# Patient Record
Sex: Male | Born: 1985 | Race: White | Hispanic: No | Marital: Married | Smoking: Never smoker
Health system: Southern US, Community
[De-identification: ages and names within clinical notes are randomized; demographics above are authoritative.]

## PROBLEM LIST (undated history)

## (undated) DIAGNOSIS — T7840XA Allergy, unspecified, initial encounter: Secondary | ICD-10-CM

## (undated) DIAGNOSIS — F32A Depression, unspecified: Secondary | ICD-10-CM

## (undated) DIAGNOSIS — F419 Anxiety disorder, unspecified: Secondary | ICD-10-CM

## (undated) HISTORY — PX: UPPER GI ENDOSCOPY: SHX6162

## (undated) HISTORY — DX: Depression, unspecified: F32.A

## (undated) HISTORY — PX: GUM SURGERY: SHX658

## (undated) HISTORY — DX: Anxiety disorder, unspecified: F41.9

## (undated) HISTORY — DX: Allergy, unspecified, initial encounter: T78.40XA

---

## 2016-07-14 ENCOUNTER — Encounter (HOSPITAL_COMMUNITY): Payer: Self-pay

## 2016-07-14 ENCOUNTER — Emergency Department (HOSPITAL_COMMUNITY)
Admission: EM | Admit: 2016-07-14 | Discharge: 2016-07-15 | Disposition: A | Discharge status: Home / Self Care | Payer: 59 | Attending: Emergency Medicine | Admitting: Emergency Medicine

## 2016-07-14 DIAGNOSIS — R197 Diarrhea, unspecified: Secondary | ICD-10-CM | POA: Insufficient documentation

## 2016-07-14 DIAGNOSIS — Z79899 Other long term (current) drug therapy: Secondary | ICD-10-CM | POA: Diagnosis not present

## 2016-07-14 DIAGNOSIS — R112 Nausea with vomiting, unspecified: Secondary | ICD-10-CM | POA: Diagnosis present

## 2016-07-14 LAB — COMPREHENSIVE METABOLIC PANEL
ALBUMIN: 5.4 g/dL — AB (ref 3.5–5.0)
ALT: 21 U/L (ref 17–63)
AST: 27 U/L (ref 15–41)
Alkaline Phosphatase: 88 U/L (ref 38–126)
Anion gap: 12 (ref 5–15)
BILIRUBIN TOTAL: 1.2 mg/dL (ref 0.3–1.2)
BUN: 20 mg/dL (ref 6–20)
CO2: 24 mmol/L (ref 22–32)
Calcium: 9.9 mg/dL (ref 8.9–10.3)
Chloride: 103 mmol/L (ref 101–111)
Creatinine, Ser: 0.92 mg/dL (ref 0.61–1.24)
GFR calc Af Amer: >60 mL/min (ref >60)
GFR calc non Af Amer: >60 mL/min (ref >60)
GLUCOSE: 130 mg/dL — AB (ref 65–99)
POTASSIUM: 3.5 mmol/L (ref 3.5–5.1)
Sodium: 139 mmol/L (ref 135–145)
TOTAL PROTEIN: 8.7 g/dL — AB (ref 6.5–8.1)

## 2016-07-14 LAB — CBC
HEMATOCRIT: 51 % (ref 39.0–52.0)
Hemoglobin: 18.3 g/dL — ABNORMAL HIGH (ref 13.0–17.0)
MCH: 30.8 pg (ref 26.0–34.0)
MCHC: 35.9 g/dL (ref 30.0–36.0)
MCV: 85.7 fL (ref 78.0–100.0)
Platelets: 252 10*3/uL (ref 150–400)
RBC: 5.95 MIL/uL — ABNORMAL HIGH (ref 4.22–5.81)
RDW: 12.7 % (ref 11.5–15.5)
WBC: 16.7 10*3/uL — ABNORMAL HIGH (ref 4.0–10.5)

## 2016-07-14 LAB — LIPASE, BLOOD: Lipase: 20 U/L (ref 11–51)

## 2016-07-14 MED ORDER — ONDANSETRON HCL 4 MG/2ML IJ SOLN
4.0000 mg | Freq: Once | INTRAMUSCULAR | Status: DC
Start: 2016-07-14 — End: 2016-07-15
  Filled 2016-07-14: qty 2

## 2016-07-14 MED ORDER — SODIUM CHLORIDE 0.9 % IV BOLUS (SEPSIS): 1000.0000 mL | Freq: Once | INTRAVENOUS | Status: DC

## 2016-07-14 MED ORDER — ONDANSETRON HCL 4 MG/2ML IJ SOLN
4.0000 mg | Freq: Once | INTRAMUSCULAR | Status: AC
  Administered 2016-07-14: 4 mg via INTRAVENOUS

## 2016-07-14 MED ORDER — ONDANSETRON 4 MG PO TBDP: 4.0000 mg | ORAL_TABLET | Freq: Three times a day (TID) | ORAL | 0 refills | Status: DC | PRN

## 2016-07-14 MED ORDER — DICYCLOMINE HCL 20 MG PO TABS: 20.0000 mg | ORAL_TABLET | Freq: Two times a day (BID) | ORAL | 0 refills | Status: DC

## 2016-07-14 MED ORDER — ONDANSETRON HCL 4 MG/2ML IJ SOLN
4.0000 mg | Freq: Once | INTRAMUSCULAR | Status: AC | PRN
  Administered 2016-07-14: 4 mg via INTRAVENOUS
  Filled 2016-07-14: qty 2

## 2016-07-14 MED ORDER — SODIUM CHLORIDE 0.9 % IV BOLUS (SEPSIS)
1000.0000 mL | Freq: Once | INTRAVENOUS | Status: AC
  Administered 2016-07-14: 1000 mL via INTRAVENOUS

## 2016-07-14 NOTE — ED Notes (Signed)
Pt provided ginger ale & encouraged to sip.

## 2016-07-14 NOTE — ED Notes (Signed)
Pt encouraged to provide urine specimen.  

## 2016-07-14 NOTE — ED Notes (Signed)
Pt stated "I'm starting to be less nauseated."

## 2016-07-14 NOTE — ED Triage Notes (Signed)
PT C/O NON-STOP N/V/D SINCE 6 PM TODAY. PT STS HIS WIFE AND SON HAD THE SAME SYMPTOMS. DENIES FEVER.

## 2016-07-14 NOTE — ED Provider Notes (Signed)
[facility]-EMERGENCY DEPT Provider Note   CSN: [id number] Arrival date & time: 07/14/16  2049  By signing my name below, I, Soijett Blue, attest that this documentation has been prepared under the direction and in the presence of Marlon Peliffany Naima Veldhuizen, PA-C Electronically Signed: Soijett Blue, ED Scribe. 07/14/16. 10:06 PM.  History   Chief Complaint Chief Complaint  Patient presents with  . Emesis  . Diarrhea    HPI Gary Robbins is a 30 y.o. male who presents to the Emergency Department complaining of emesis x 12 episodes onset today. Pt notes that he has similar sick contacts of his wife and son. He states that he is having associated symptoms of nausea, diarrhea x 12 episodes, and mid abdominal pain. He has tried pepto-bismol with no relief for his symptoms. He denies blood in stool, fever, chills, and any other symptoms.   The history is provided by the patient. No language interpreter was used.   History reviewed. No pertinent past medical history.  There are no active problems to display for this patient.   History reviewed. No pertinent surgical history.   Home Medications    Prior to Admission medications   Medication Sig Start Date End Date Taking? Authorizing Provider  dicyclomine (BENTYL) 20 MG tablet Take 1 tablet (20 mg total) by mouth 2 (two) times daily. 07/14/16   Edahi Kroening Neva SeatGreene, PA-C  ondansetron (ZOFRAN ODT) 4 MG disintegrating tablet Take 1 tablet (4 mg total) by mouth every 8 (eight) hours as needed for nausea or vomiting. 07/14/16   Marlon Peliffany Syler Norcia, PA-C    Family History History reviewed. No pertinent family history.  Social History Social History  Substance Use Topics  . Smoking status: Never Smoker  . Smokeless tobacco: Never Used  . Alcohol use Yes     Comment: OCCASIONAL     Allergies   Patient has no known allergies.   Review of Systems Review of Systems  Constitutional: Negative for chills and fever.  Gastrointestinal: Positive for  abdominal pain, diarrhea, nausea and vomiting. Negative for blood in stool.     Physical Exam Updated Vital Signs BP 108/73 (BP Location: Left Arm)   Pulse 91   Temp 98.7 F (37.1 C) (Oral)   Resp 18   Ht 5\' 8"  (1.727 m)   Wt 68 kg   SpO2 96%   BMI 22.81 kg/m   Physical Exam  Constitutional: He is oriented to person, place, and time. He appears well-developed and well-nourished. No distress.  HENT:  Head: Normocephalic and atraumatic.  Eyes: EOM are normal.  Neck: Neck supple.  Cardiovascular: Normal rate.   Pulmonary/Chest: Effort normal. No respiratory distress.  Abdominal: Soft. He exhibits no distension. Bowel sounds are increased. There is no tenderness. There is no rigidity, no guarding, no CVA tenderness and negative Murphy's sign.  Musculoskeletal: Normal range of motion.  Neurological: He is alert and oriented to person, place, and time.  Skin: Skin is warm and dry.  Psychiatric: He has a normal mood and affect. His behavior is normal.  Nursing note and vitals reviewed.   ED Treatments / Results  DIAGNOSTIC STUDIES: Oxygen Saturation is 100% on RA, nl by my interpretation.    COORDINATION OF CARE: 10:03 PM Discussed treatment plan with pt at bedside which includes labs, UA, zofran, and pt agreed to plan.  11:38 PM- Pt re-evaluated with noted improvement of symptoms following IV fluids and antiemetics.   Labs (all labs ordered are listed, but only abnormal results are displayed) Labs Reviewed  COMPREHENSIVE METABOLIC PANEL - Abnormal; Notable for the following:       Result Value   Glucose, Bld 130 (*)    Total Protein 8.7 (*)    Albumin 5.4 (*)    All other components within normal limits  CBC - Abnormal; Notable for the following:    WBC 16.7 (*)    RBC 5.95 (*)    Hemoglobin 18.3 (*)    All other components within normal limits  LIPASE, BLOOD   Procedures Procedures (including critical care time)  Medications Ordered in ED Medications    ondansetron (ZOFRAN) injection 4 mg (4 mg Intravenous Given 07/14/16 2140)  sodium chloride 0.9 % bolus 1,000 mL (0 mLs Intravenous Stopped 07/14/16 2320)  ondansetron (ZOFRAN) injection 4 mg (4 mg Intravenous Given 07/14/16 2321)    Initial Impression / Assessment and Plan / ED Course  I have reviewed the triage vital signs and the nursing notes.  Pertinent labs that were available during my care of the patient were reviewed by me and considered in my medical decision making (see chart for details).  Clinical Course     Patient is nontoxic, nonseptic appearing, in no apparent distress.  Patient's symptoms adequately managed in emergency department.  Fluid bolus given.  Labs, imaging and vitals reviewed.  Patient does not meet the SIRS or Sepsis criteria.  On repeat exam patient does not have a surgical abdomin and there are no peritoneal signs.  No indication of appendicitis, bowel obstruction, bowel perforation, cholecystitis, diverticulitis.  Patient discharged home with symptomatic treatment and given strict instructions for follow-up with their primary care physician. I have also discussed reasons to return immediately to the ER.  Patient expresses understanding and agrees with plan.   Final Clinical Impressions(s) / ED Diagnoses   Final diagnoses:  Nausea vomiting and diarrhea   New Prescriptions Discharge Medication List as of 07/14/2016 11:38 PM    START taking these medications   Details  dicyclomine (BENTYL) 20 MG tablet Take 1 tablet (20 mg total) by mouth 2 (two) times daily., Starting Tue 07/14/2016, Print    ondansetron (ZOFRAN ODT) 4 MG disintegrating tablet Take 1 tablet (4 mg total) by mouth every 8 (eight) hours as needed for nausea or vomiting., Starting Tue 07/14/2016, Print       I personally performed the services described in this documentation, which was scribed in my presence. The recorded information has been reviewed and is accurate.    Marlon Peliffany Kattie Santoyo,  PA-C 07/19/16 2026    Linwood DibblesJon Knapp, MD 07/20/16 [phone]

## 2016-07-19 NOTE — ED Provider Notes (Signed)
Medical screening examination/treatment/procedure(s) were performed by non-physician practitioner, Delsa Berniffanty Greene and as supervising physician I was immediately available for consultation/collaboration.     Gary DibblesJon Jariah Tarkowski, MD 07/19/16 [phone]

## 2020-10-30 ENCOUNTER — Emergency Department (HOSPITAL_COMMUNITY)
Admission: EM | Admit: 2020-10-30 | Discharge: 2020-10-30 | Disposition: A | Discharge status: Home / Self Care | Payer: No Typology Code available for payment source | Attending: Emergency Medicine | Admitting: Emergency Medicine

## 2020-10-30 ENCOUNTER — Encounter (HOSPITAL_COMMUNITY): Payer: Self-pay

## 2020-10-30 ENCOUNTER — Other Ambulatory Visit: Payer: Self-pay

## 2020-10-30 DIAGNOSIS — R1084 Generalized abdominal pain: Secondary | ICD-10-CM | POA: Diagnosis not present

## 2020-10-30 DIAGNOSIS — F419 Anxiety disorder, unspecified: Secondary | ICD-10-CM | POA: Diagnosis not present

## 2020-10-30 DIAGNOSIS — R110 Nausea: Secondary | ICD-10-CM | POA: Diagnosis present

## 2020-10-30 LAB — URINALYSIS, ROUTINE W REFLEX MICROSCOPIC
Bacteria, UA: NONE SEEN
Bilirubin Urine: NEGATIVE
Glucose, UA: NEGATIVE mg/dL
Ketones, ur: NEGATIVE mg/dL
Leukocytes,Ua: NEGATIVE
Nitrite: NEGATIVE
Protein, ur: NEGATIVE mg/dL
Specific Gravity, Urine: 1.003 — ABNORMAL LOW (ref 1.005–1.030)
pH: 7 (ref 5.0–8.0)

## 2020-10-30 LAB — CBC
HCT: 45.5 % (ref 39.0–52.0)
Hemoglobin: 15.7 g/dL (ref 13.0–17.0)
MCH: 30.3 pg (ref 26.0–34.0)
MCHC: 34.5 g/dL (ref 30.0–36.0)
MCV: 87.7 fL (ref 80.0–100.0)
Platelets: 291 10*3/uL (ref 150–400)
RBC: 5.19 MIL/uL (ref 4.22–5.81)
RDW: 12.4 % (ref 11.5–15.5)
WBC: 6.6 10*3/uL (ref 4.0–10.5)
nRBC: 0 % (ref 0.0–0.2)

## 2020-10-30 LAB — COMPREHENSIVE METABOLIC PANEL
ALT: 15 U/L (ref 0–44)
AST: 20 U/L (ref 15–41)
Albumin: 4.7 g/dL (ref 3.5–5.0)
Alkaline Phosphatase: 72 U/L (ref 38–126)
Anion gap: 8 (ref 5–15)
BUN: 10 mg/dL (ref 6–20)
CO2: 27 mmol/L (ref 22–32)
Calcium: 9.6 mg/dL (ref 8.9–10.3)
Chloride: 107 mmol/L (ref 98–111)
Creatinine, Ser: 0.84 mg/dL (ref 0.61–1.24)
GFR, Estimated: >60 mL/min (ref >60)
Glucose, Bld: 95 mg/dL (ref 70–99)
Potassium: 3.8 mmol/L (ref 3.5–5.1)
Sodium: 142 mmol/L (ref 135–145)
Total Bilirubin: 1.1 mg/dL (ref 0.3–1.2)
Total Protein: 7.9 g/dL (ref 6.5–8.1)

## 2020-10-30 LAB — LIPASE, BLOOD: Lipase: 32 U/L (ref 11–51)

## 2020-10-30 MED ORDER — LORAZEPAM 2 MG/ML IJ SOLN
0.5000 mg | Freq: Once | INTRAMUSCULAR | Status: AC
  Administered 2020-10-30: 0.5 mg via INTRAVENOUS
  Filled 2020-10-30: qty 1

## 2020-10-30 MED ORDER — PANTOPRAZOLE SODIUM 40 MG IV SOLR
40.0000 mg | Freq: Once | INTRAVENOUS | Status: AC
  Administered 2020-10-30: 40 mg via INTRAVENOUS
  Filled 2020-10-30: qty 40

## 2020-10-30 MED ORDER — SODIUM CHLORIDE 0.9 % IV BOLUS
1000.0000 mL | Freq: Once | INTRAVENOUS | Status: AC
  Administered 2020-10-30: 1000 mL via INTRAVENOUS

## 2020-10-30 MED ORDER — ONDANSETRON HCL 4 MG/2ML IJ SOLN
4.0000 mg | Freq: Once | INTRAMUSCULAR | Status: AC
  Administered 2020-10-30: 4 mg via INTRAVENOUS
  Filled 2020-10-30: qty 2

## 2020-10-30 MED ORDER — ALUM & MAG HYDROXIDE-SIMETH 200-200-20 MG/5ML PO SUSP
30.0000 mL | Freq: Once | ORAL | Status: AC
  Administered 2020-10-30: 30 mL via ORAL
  Filled 2020-10-30: qty 30

## 2020-10-30 MED ORDER — OMEPRAZOLE 20 MG PO CPDR: 20.0000 mg | DELAYED_RELEASE_CAPSULE | Freq: Every day | ORAL | 0 refills | Status: DC

## 2020-10-30 MED ORDER — LIDOCAINE VISCOUS HCL 2 % MT SOLN
15.0000 mL | Freq: Once | OROMUCOSAL | Status: AC
  Administered 2020-10-30: 15 mL via ORAL
  Filled 2020-10-30: qty 15

## 2020-10-30 MED ORDER — FAMOTIDINE IN NACL 20-0.9 MG/50ML-% IV SOLN
20.0000 mg | Freq: Once | INTRAVENOUS | Status: AC
  Administered 2020-10-30: 20 mg via INTRAVENOUS
  Filled 2020-10-30: qty 50

## 2020-10-30 NOTE — ED Provider Notes (Signed)
[facility]-EMERGENCY DEPT Provider Note   CSN: [id number] Arrival date & time: 10/30/20  0636     History Chief Complaint  Patient presents with  . Nausea    Gary Robbins is a 35 y.o. male.  HPI   35 year old male with no admitted past medical history presents emergency department concern for generalized abdominal pain associated with nausea and anxiety.  Patient states the symptoms started a week ago, they had initially improved but returned a couple days ago.  He presents today because he is very anxious about his health.  He has nausea, mainly after eating.  Denies any vomiting, diarrhea, chest pain, shortness of breath.  No recent fever.  No abdominal distention.  No previous surgeries in his abdomen, no genitourinary symptoms.  History reviewed. No pertinent past medical history.  There are no problems to display for this patient.   History reviewed. No pertinent surgical history.     No family history on file.  Social History   Tobacco Use  . Smoking status: Never Smoker  . Smokeless tobacco: Never Used  Substance Use Topics  . Alcohol use: Yes    Comment: OCCASIONAL  . Drug use: No    Home Medications Prior to Admission medications   Medication Sig Start Date End Date Taking? Authorizing Provider  dicyclomine (BENTYL) 20 MG tablet Take 1 tablet (20 mg total) by mouth 2 (two) times daily. 07/14/16   Marlon Pel, PA-C  ondansetron (ZOFRAN ODT) 4 MG disintegrating tablet Take 1 tablet (4 mg total) by mouth every 8 (eight) hours as needed for nausea or vomiting. 07/14/16   Marlon Pel, PA-C    Allergies    Patient has no known allergies.  Review of Systems   Review of Systems  Constitutional: Negative for chills and fever.  HENT: Negative for congestion.   Eyes: Negative for visual disturbance.  Respiratory: Negative for shortness of breath.   Cardiovascular: Negative for chest pain.  Gastrointestinal: Positive for  abdominal pain and nausea. Negative for diarrhea and vomiting.  Genitourinary: Negative for dysuria.  Skin: Negative for rash.  Neurological: Negative for headaches.    Physical Exam Updated Vital Signs BP 108/71 (BP Location: Right Arm)   Pulse 71   Temp 98.5 F (36.9 C) (Oral)   Resp 16   Ht 5\' 9"  (1.753 m)   Wt 70.3 kg   SpO2 98%   BMI 22.89 kg/m   Physical Exam Vitals and nursing note reviewed.  Constitutional:      Appearance: Normal appearance.  HENT:     Head: Normocephalic.     Mouth/Throat:     Mouth: Mucous membranes are moist.  Cardiovascular:     Rate and Rhythm: Normal rate.  Pulmonary:     Effort: Pulmonary effort is normal. No respiratory distress.  Abdominal:     General: There is no distension.     Palpations: Abdomen is soft.     Tenderness: There is no abdominal tenderness. There is no guarding.  Skin:    General: Skin is warm.  Neurological:     Mental Status: He is alert and oriented to person, place, and time. Mental status is at baseline.  Psychiatric:     Comments: Anxious but pleasant     ED Results / Procedures / Treatments   Labs (all labs ordered are listed, but only abnormal results are displayed) Labs Reviewed  URINALYSIS, ROUTINE W REFLEX MICROSCOPIC - Abnormal; Notable for the following components:  Result Value   Color, Urine STRAW (*)    Specific Gravity, Urine 1.003 (*)    Hgb urine dipstick SMALL (*)    All other components within normal limits  LIPASE, BLOOD  COMPREHENSIVE METABOLIC PANEL  CBC    EKG None  Radiology No results found.  Procedures Procedures   Medications Ordered in ED Medications  famotidine (PEPCID) IVPB 20 mg premix (20 mg Intravenous New Bag/Given 10/30/20 0755)  sodium chloride 0.9 % bolus 1,000 mL (1,000 mLs Intravenous New Bag/Given 10/30/20 0754)  ondansetron (ZOFRAN) injection 4 mg (4 mg Intravenous Given 10/30/20 0757)  LORazepam (ATIVAN) injection 0.5 mg (0.5 mg Intravenous Given  10/30/20 0759)    ED Course  I have reviewed the triage vital signs and the nursing notes.  Pertinent labs & imaging results that were available during my care of the patient were reviewed by me and considered in my medical decision making (see chart for details).    MDM Rules/Calculators/A&P                          35 year old male presents emergency department generalized abdominal pain and nausea.  Vitals are stable on arrival, he is afebrile.  Abdominal exam is benign.  Low suspicion for acute abdominal process.  Will obtain blood work and treat symptomatically.  Lab evaluation is all normal.  Vitals remain normal, abdominal exam remains benign.  After medications patient feels improved, he was able to p.o.  Plan for outpatient treatment and follow-up.  Final Clinical Impression(s) / ED Diagnoses Final diagnoses:  None    Rx / DC Orders ED Discharge Orders    None       Rozelle Logan, DO 10/30/20 1052

## 2020-10-30 NOTE — ED Triage Notes (Signed)
Pt reports waking up 1 week ago with generalized abdominal pain and nausea. Pt sts this morning waking up extremely nauseated again. Pt sts last BM was Monday.

## 2020-10-30 NOTE — Discharge Instructions (Signed)
You have been seen and discharged from the emergency department.  Take new medication as prescribed. Follow-up with your primary provider for reevaluation and further care. Establish care with GI for further evaluation. Take home medications as prescribed. If you have any worsening symptoms, chest pain, difficulty breathing or further concerns for health please return to an emergency department for further evaluation.

## 2020-10-30 NOTE — ED Notes (Signed)
Discharge packet provided and reviewed.  The patient verbalizes understanding.  The patient is discharged to his home

## 2020-10-30 NOTE — ED Notes (Signed)
Patient ambulatory to restroom with no distress 

## 2020-10-30 NOTE — ED Notes (Signed)
Ambulated to the bathroom with assistance- tolerates well

## 2020-11-13 ENCOUNTER — Other Ambulatory Visit: Payer: Self-pay

## 2020-11-13 ENCOUNTER — Ambulatory Visit (INDEPENDENT_AMBULATORY_CARE_PROVIDER_SITE_OTHER): Payer: No Typology Code available for payment source | Admitting: Gastroenterology

## 2020-11-13 ENCOUNTER — Other Ambulatory Visit: Payer: No Typology Code available for payment source

## 2020-11-13 ENCOUNTER — Encounter: Payer: Self-pay | Admitting: Gastroenterology

## 2020-11-13 VITALS — BP 90/58 | HR 104 | Ht 67.75 in | Wt 151.1 lb

## 2020-11-13 DIAGNOSIS — R1084 Generalized abdominal pain: Secondary | ICD-10-CM

## 2020-11-13 DIAGNOSIS — K580 Irritable bowel syndrome with diarrhea: Secondary | ICD-10-CM

## 2020-11-13 DIAGNOSIS — R112 Nausea with vomiting, unspecified: Secondary | ICD-10-CM

## 2020-11-13 DIAGNOSIS — R634 Abnormal weight loss: Secondary | ICD-10-CM | POA: Diagnosis not present

## 2020-11-13 DIAGNOSIS — R194 Change in bowel habit: Secondary | ICD-10-CM

## 2020-11-13 DIAGNOSIS — K649 Unspecified hemorrhoids: Secondary | ICD-10-CM

## 2020-11-13 DIAGNOSIS — K625 Hemorrhage of anus and rectum: Secondary | ICD-10-CM

## 2020-11-13 NOTE — Progress Notes (Signed)
GASTROENTEROLOGY OUTPATIENT CLINIC VISIT   Primary Care Provider Pcp, No No address on file None  Referring Provider No referring provider defined for this encounter.   Patient Profile: Beatris ShipChristopher Tollett is a 35 y.o. male without significant PMH.  The patient presents to the [facility] for an evaluation and management of problem(s) noted below:  Problem List 1. Non-intractable vomiting with nausea, unspecified vomiting type   2. Generalized abdominal pain   3. Unintentional weight loss   4. Change in bowel habits   5. Hemorrhoids, unspecified hemorrhoid type   6. BRBPR (bright red blood per rectum)   7. Irritable bowel syndrome with diarrhea     History of Present Illness This is the patient's first visit to the outpatient [facility].  For the last 2-1/2 weeks, the patient has had new onset GI symptoms.  The symptoms have included nausea and vomiting as well as alteration in his bowel habits.  On April 4, the patient began having significant issues and eventually went to urgent care where he was given Zofran which did not make much of a difference.  Pain developed and subsequently he went to the emergency department.  He was given Prilosec and Maalox.  Things improved somewhat.  He was still having nausea.  10 days ago he began to experience diarrhea.  He then was evaluated at [facility] for further evaluation where he had noted blood tests and was told that his thyroid function and celiac testing was negative but we do not have access to those records.  He was initiated on a TCA for issues of insomnia.  He was initiated on Bentyl.  He was also told to take Benefiber, Metamucil, MiraLAX, stool softeners (Colace), probiotics (align), and Imodium altogether.  Patient states he thought this was quite a lot of medications to be all given it once and His clinic visit today to further evaluate if anything else was going on.  He has continued taking Prilosec and is  no longer taking Maalox and is finally beginning to feel better.  He still has some nausea at times but this has not led to any vomiting.  He has not had to use Phenergan in a few days.  He is not sure if the TCA is helping anymore.  He has been able to transition somewhat back to a more normal diet after following a brat diet for the last few weeks.  Patient has had issues of rectal bleeding in the past for which he notices blood on the toilet paper and in the toilet bowl at times.  It is weeks between this occurring but it is painless in nature.  He believes he has hemorrhoids.  Patient also describes an issue of possible diagnosis of irritable bowel syndrome.  Whenever he has a presentation or is nervous he starts having more bowel movements that are diarrhea-like.  Once this period of anxiety passes he is improved.  He has felt some increased anxiety over the course of the last few weeks because of his symptoms but now that things are getting better he feels that is improving as well.  He has never had an upper or lower endoscopy.  He does not drink alcohol.  There is no family history of GI malignancies.  GI Review of Systems Positive as above Negative for dysphagia, odynophagia, decreased appetite, anorexia, bloating, melena  Review of Systems General: Denies fevers/chills HEENT: Denies oral lesions Cardiovascular: Denies chest pain/palpitations Pulmonary: Denies shortness of breath Gastroenterological:  See HPI Genitourinary: Denies darkened urine Hematological: Denies easy bruising/bleeding Endocrine: Denies temperature intolerance Dermatological: Denies jaundice Psychological: Mood is stable   Medications Current Outpatient Medications  Medication Sig Dispense Refill  . amitriptyline (ELAVIL) 25 MG tablet Take 25 mg by mouth at bedtime.    . dicyclomine (BENTYL) 20 MG tablet Take 1 tablet (20 mg total) by mouth 2 (two) times daily. 20 tablet 0  . omeprazole (PRILOSEC) 20 MG capsule  Take 1 capsule (20 mg total) by mouth daily. 30 capsule 0  . ondansetron (ZOFRAN ODT) 4 MG disintegrating tablet Take 1 tablet (4 mg total) by mouth every 8 (eight) hours as needed for nausea or vomiting. (Patient not taking: Reported on 11/13/2020) 20 tablet 0  . promethazine (PHENERGAN) 12.5 MG tablet Take 12.5 mg by mouth every 6 (six) hours as needed. (Patient not taking: Reported on 11/13/2020)     No current facility-administered medications for this visit.    Allergies No Known Allergies  Histories Past Medical History:  Diagnosis Date  . Anxiety    Past Surgical History:  Procedure Laterality Date  . GUM SURGERY     Social History   Socioeconomic History  . Marital status: Married    Spouse name: Not on file  . Number of children: 1  . Years of education: Not on file  . Highest education level: Not on file  Occupational History  . Occupation: Scientist, research (life sciences)  . Occupation:    Tobacco Use  . Smoking status: Never Smoker  . Smokeless tobacco: Never Used  Vaping Use  . Vaping Use: Never used  Substance and Sexual Activity  . Alcohol use: Yes    Comment: OCCASIONAL once a month  . Drug use: No  . Sexual activity: Not on file  Other Topics Concern  . Not on file  Social History Narrative  . Not on file   Social Determinants of Health   Financial Resource Strain: Not on file  Food Insecurity: Not on file  Transportation Needs: Not on file  Physical Activity: Not on file  Stress: Not on file  Social Connections: Not on file  Intimate Partner Violence: Not on file   Family History  Problem Relation Age of Onset  . Asthma Mother   . Depression Mother   . Hyperlipidemia Mother   . Bipolar disorder Mother   . Hearing loss Father   . Depression Maternal Grandmother   . Dementia Maternal Grandmother   . Arthritis Maternal Grandmother   . Glaucoma Maternal Grandmother   . Prostate cancer Maternal Grandfather   . Bone cancer Maternal Grandfather   .  Heart disease Paternal Grandfather   . Hyperlipidemia Paternal Grandfather   . Breast cancer Other        maternal great aunts  . Diabetes Maternal Great-grandmother   . Heart disease Maternal Great-grandmother   . Colon cancer Neg Hx   . Esophageal cancer Neg Hx   . Inflammatory bowel disease Neg Hx   . Liver disease Neg Hx   . Pancreatic cancer Neg Hx   . Rectal cancer Neg Hx   . Stomach cancer Neg Hx    I have reviewed his medical, social, and family history in detail and updated the electronic medical record as necessary.    PHYSICAL EXAMINATION  BP (!) 90/58 (BP Location: Left Arm, Patient Position: Sitting, Cuff Size: Normal)   Pulse (!) 104   Ht 5' 7.75" (1.721 m) Comment: height measured without shoes  Wt 151  lb 2 oz (68.5 kg)   BMI 23.15 kg/m  Wt Readings from Last 3 Encounters:  11/13/20 151 lb 2 oz (68.5 kg)  10/30/20 155 lb (70.3 kg)  07/14/16 150 lb (68 kg)  GEN: NAD, appears stated age, doesn't appear chronically ill PSYCH: Cooperative, without pressured speech EYE: Conjunctivae pink, sclerae anicteric ENT: MMM, without oral ulcers, no erythema or exudates noted CV: RR without R/Gs  RESP: CTAB posteriorly, without wheezing GI: NABS, soft, NT/ND, without rebound or guarding, no HSM appreciated GU: DRE deferred MSK/EXT: No lower extremity edema SKIN: No jaundice NEURO:  Alert & Oriented x 3, no focal deficits   REVIEW OF DATA  I reviewed the following data at the time of this encounter:  GI Procedures and Studies  No relevant studies to review  Laboratory Studies  Reviewed those in epic and will have to obtain [facility] records  Imaging Studies  No relevant studies to review   ASSESSMENT  Mr. Pentecost is a 35 y.o. male without significant PMH.  The patient is seen today for evaluation and management of:  1. Non-intractable vomiting with nausea, unspecified vomiting type   2. Generalized abdominal pain   3. Unintentional weight loss   4. Change in  bowel habits   5. Hemorrhoids, unspecified hemorrhoid type   6. BRBPR (bright red blood per rectum)   7. Irritable bowel syndrome with diarrhea    The patient is hemodynamically and clinically stable.  Patient's symptoms seem to be abating at this time.  Most likely this is some sort of infectious gastroenteritis with subsequent dysbiosis.  With this being said, it is important for [place] to monitor symptoms closely.  The patient's multiple medications which were recently initiated did not make great sense to continue medicines that have different purposes.  We will asked the patient to stop one of his fibers, stop MiraLAX, stop Colace.  He will continue loperamide for the next 2 to 3 days and then stop it.  He may continue align.  He is going to continue Prilosec for 1 month daily and then every other day for another month.  We are going to do some laboratory work-up as well as some stool studies to further evaluate his symptoms.  We will proceed with abdominal ultrasound to further evaluate the potential etiology of his initial symptoms and ensure there is no other significant abnormalities.  We will see him in follow-up and based on how he does we will consider the role of endoscopic evaluation if symptoms persist or continue.  All patient questions were answered to the best of my ability, and the patient agrees to the aforementioned plan of action with follow-up as indicated.   PLAN  Stop one of the fibers Stop MiraLAX Stop Colace Okay to continue align Imodium for 3 days and then stop Prilosec x1 month completely and then every other day H. pylori stool antigen to be obtained when off PPI Abdominal U/S to be obtained If issues recur/persist then EGD/Colonoscopy recommended   Orders Placed This Encounter  Procedures  . US Abdomen Complete  . H. pylori antibody, IgG    New Prescriptions   No medications on file   Modified Medications   No medications on file    Planned Follow Up No  follow-ups on file.   Total Time in Face-to-Face and in Coordination of Care for patient including independent/personal interpretation/review of prior testing, medical history, examination, medication adjustment, communicating results with the patient directly, and documentation with the  EHR is 45 minutes.   Corliss Parish, MD [facility] Office # [phone]

## 2020-11-13 NOTE — Patient Instructions (Signed)
STOP: Fibers, Miralax, Colace.   Continue: Align daily. Continue Loperamide for the next 2-3 days, then stop.   Continue Prilosec for 1 month daily, then take every other day for 1 month then stop.   Your provider has requested that you go to the basement level for lab work -stool studies (2 months). Press "B" on the elevator. The lab is located at the first door on the left as you exit the elevator.   You have been scheduled for an abdominal ultrasound at [facility] (1st floor of hospital) on 4/26/22at 10:30am. Please arrive 15 minutes prior to your appointment for registration. Make certain not to have anything to eat or drink 6 hours prior to your appointment. Should you need to reschedule your appointment, please contact radiology at [phone]. This test typically takes about 30 minutes to perform.  If you are age 51 or older, your body mass index should be between 23-30. Your Body mass index is 23.15 kg/m. If this is out of the aforementioned range listed, please consider follow up with your Primary Care Provider.  If you are age 21 or younger, your body mass index should be between 19-25. Your Body mass index is 23.15 kg/m. If this is out of the aformentioned range listed, please consider follow up with your Primary Care Provider.    Thank you for choosing me and [facility].  Dr. Meridee Score

## 2020-11-17 ENCOUNTER — Encounter: Payer: Self-pay | Admitting: Gastroenterology

## 2020-11-17 DIAGNOSIS — K625 Hemorrhage of anus and rectum: Secondary | ICD-10-CM | POA: Insufficient documentation

## 2020-11-17 DIAGNOSIS — K649 Unspecified hemorrhoids: Secondary | ICD-10-CM | POA: Insufficient documentation

## 2020-11-17 DIAGNOSIS — R634 Abnormal weight loss: Secondary | ICD-10-CM | POA: Insufficient documentation

## 2020-11-17 DIAGNOSIS — R194 Change in bowel habit: Secondary | ICD-10-CM | POA: Insufficient documentation

## 2020-11-17 DIAGNOSIS — K580 Irritable bowel syndrome with diarrhea: Secondary | ICD-10-CM | POA: Insufficient documentation

## 2020-11-17 DIAGNOSIS — R1084 Generalized abdominal pain: Secondary | ICD-10-CM | POA: Insufficient documentation

## 2020-11-17 DIAGNOSIS — R1110 Vomiting, unspecified: Secondary | ICD-10-CM | POA: Insufficient documentation

## 2020-11-19 ENCOUNTER — Other Ambulatory Visit: Payer: Self-pay

## 2020-11-19 ENCOUNTER — Ambulatory Visit (HOSPITAL_COMMUNITY)
Admission: RE | Admit: 2020-11-19 | Discharge: 2020-11-19 | Disposition: A | Discharge status: Home / Self Care | Payer: No Typology Code available for payment source | Source: Ambulatory Visit | Attending: Gastroenterology | Admitting: Gastroenterology

## 2020-11-19 DIAGNOSIS — K649 Unspecified hemorrhoids: Secondary | ICD-10-CM | POA: Diagnosis present

## 2020-11-19 DIAGNOSIS — R634 Abnormal weight loss: Secondary | ICD-10-CM | POA: Diagnosis not present

## 2020-11-19 DIAGNOSIS — R194 Change in bowel habit: Secondary | ICD-10-CM

## 2020-11-19 DIAGNOSIS — K625 Hemorrhage of anus and rectum: Secondary | ICD-10-CM | POA: Diagnosis present

## 2020-11-19 NOTE — Progress Notes (Signed)
Order for stool antigen placed in epic.

## 2020-11-20 ENCOUNTER — Other Ambulatory Visit: Payer: No Typology Code available for payment source

## 2020-11-20 ENCOUNTER — Other Ambulatory Visit: Payer: Self-pay

## 2020-11-20 DIAGNOSIS — R197 Diarrhea, unspecified: Secondary | ICD-10-CM

## 2020-11-20 NOTE — Telephone Encounter (Signed)
Due to the persistence of alteration of bowel habits we will proceed with stool studies Stool culture, stool ova and parasite, C. difficile, fecal elastase, fecal leukocytes Patient can come and pick up stool kit to have these stool studies performed. Hopefully symptoms do not persist and he will not need to do these.  FYI Patty.

## 2020-11-21 ENCOUNTER — Other Ambulatory Visit: Payer: No Typology Code available for payment source

## 2020-11-21 DIAGNOSIS — K625 Hemorrhage of anus and rectum: Secondary | ICD-10-CM

## 2020-11-21 DIAGNOSIS — K649 Unspecified hemorrhoids: Secondary | ICD-10-CM

## 2020-11-21 DIAGNOSIS — R634 Abnormal weight loss: Secondary | ICD-10-CM

## 2020-11-21 DIAGNOSIS — R197 Diarrhea, unspecified: Secondary | ICD-10-CM

## 2020-11-21 DIAGNOSIS — R194 Change in bowel habit: Secondary | ICD-10-CM

## 2020-11-25 LAB — STOOL CULTURE: E coli, Shiga toxin Assay: NEGATIVE

## 2020-12-02 LAB — OVA AND PARASITE EXAMINATION
CONCENTRATE RESULT:: NONE SEEN
MICRO NUMBER:: 11826275
SPECIMEN QUALITY:: ADEQUATE
TRICHROME RESULT:: NONE SEEN

## 2020-12-02 LAB — HELICOBACTER PYLORI  SPECIAL ANTIGEN
MICRO NUMBER:: 11826239
SPECIMEN QUALITY: ADEQUATE

## 2020-12-02 LAB — CLOSTRIDIUM DIFFICILE TOXIN B, QUALITATIVE, REAL-TIME PCR: Toxigenic C. Difficile by PCR: NOT DETECTED

## 2020-12-02 LAB — FECAL LEUKOCYTES
Micro Number: 11826274
Result: NOT DETECTED
Specimen Quality: ADEQUATE

## 2020-12-02 LAB — PANCREATIC ELASTASE, FECAL: Pancreatic Elastase-1, Stool: >500 mcg/g

## 2020-12-17 ENCOUNTER — Other Ambulatory Visit: Payer: No Typology Code available for payment source

## 2020-12-17 ENCOUNTER — Institutional Professional Consult (permissible substitution): Payer: No Typology Code available for payment source | Admitting: Pulmonary Disease

## 2020-12-17 NOTE — Addendum Note (Signed)
Addended by: Loretha Stapler on: 12/17/2020 09:23 AM   Modules accepted: Orders

## 2020-12-18 ENCOUNTER — Other Ambulatory Visit: Payer: No Typology Code available for payment source

## 2020-12-18 DIAGNOSIS — R197 Diarrhea, unspecified: Secondary | ICD-10-CM

## 2020-12-19 ENCOUNTER — Telehealth: Payer: Self-pay

## 2020-12-19 DIAGNOSIS — R112 Nausea with vomiting, unspecified: Secondary | ICD-10-CM

## 2020-12-19 NOTE — Telephone Encounter (Signed)
June 10 is full also.

## 2020-12-19 NOTE — Telephone Encounter (Signed)
Patty, I have replied to the patient. Lets start him on Carafate 1 g 2-4 times daily with meals plus at bedtime.  Ideally would do as a liquid and if he cannot afford it due to cost then do it as the tablet/slurry. If patient is using Zofran 8 mg 2-3 times daily without having effect then Phenergan 12.5 mg every 6 hour as needed can be used and sent as a prescription. Patient should be scheduled for an EGD/flex at 430 slot on June 2.  I know that I will be overbooked slot but should be quick biopsies of the stomach and small intestine and quick biopsies of the left colon and out. Thanks for the help as always. GM

## 2020-12-19 NOTE — Telephone Encounter (Signed)
You  Mansouraty, Telford Nab., MD 1 minute ago (9:44 AM)       Dr Gary Robbins [facility] says that EGD flex can not be on 6/2 at 4:30 pm. Please advise       Documentation    You routed conversation to You 1 hour ago (8:10 AM)   Bruna, Steed "Gary Robbins"  Mansouraty, Telford Nab., MD 3 hours ago (6:22 AM)      Thank you doctor. I'll try increasing my dosage of zofran from 4 mg to 8 mg as you suggested and see if that helps. Yesterday the nausea was able to subside by the afternoon. Luckily, I've also not had any symptoms at night and have been able to sleep. The symptoms are at their worst in the morning. I wake up vomiting and having diarrhea multiple times. Then I deal with the general nausea until it subsides later in the afternoon.   I also spoke with a mental health physician and will be going through a 6-week program starting Tuesday to see if that could also be contributing to this. I greatly appreciate your help.      Mansouraty, Telford Nab., MD 3 hours ago (6:00 AM)       Gary Robbins, I have replied to the patient. Lets start him on Carafate 1 g 2-4 times daily with meals plus at bedtime.  Ideally would do as a liquid and if he cannot afford it due to cost then do it as the tablet/slurry. If patient is using Zofran 8 mg 2-3 times daily without having effect then Phenergan 12.5 mg every 6 hour as needed can be used and sent as a prescription. Patient should be scheduled for an EGD/flex at 430 slot on June 2.  I know that I will be overbooked slot but should be quick biopsies of the stomach and small intestine and quick biopsies of the left colon and out. Thanks for the help as always. GM       Documentation    Mansouraty, Telford Nab., MD  [name], [name] 3 hours ago (5:56 AM)      Gary Robbins, I am sorry to hear how you continue to feel poorly when you had been doing well. Your stool studies are pending at this time. I am going to have my team send you a  medication known as Carafate, this helps patient's who may have gastritis try and have a coating effect on the stomach.  It is usually given as a liquid which I think is better tolerated than the tablet but sometimes insurance/cost may just have to use a tablet form or slurring.  You will use this 2-4 times daily.  Hopefully it will help.  I am going to ask my team to also go ahead and start looking for an endoscopy slot so that we can go ahead and get you set up to look inside your stomach to understand why your symptoms are persisting.  I will try to see if we can also do a short flexible sigmoidoscopy at the same time to look at your colon (it will not be a full colonoscopy as we do not have time for a double slot for quite a few weeks) but could help shed some light as to some symptoms in regards to your diarrheal symptoms as well. My team should reach out to you by the end of this week on working to try and get that set up. I will also send in a new medication called Phenergan  that you may use if taking 8 mg of Zofran is not helping you from a nausea perspective. May also be best to consider having your self checked for COVID-19 as many symptoms could be a result of this type of infection as well.  I would discuss this further with your PCP.  Good luck and good health.  Gary Britain, MD     You routed conversation to Mansouraty, Telford Nab., MD Yesterday (8:54 AM)   [name], [name]  Mansouraty, Telford Nab., MD Yesterday (8:27 AM)      Anti-nausea medication is doing nothing for vomiting.      [name], [name]  Mansouraty, Telford Nab., MD Yesterday (7:58 AM)      I also lost my appetite Monday and hasn't returned yet. Unable to eat more than the occasional applesauce or banana.      [name], [name]  Mansouraty, Telford Nab., MD Yesterday (7:56 AM)      Dropped off the tests this morning. Symptoms are worsening. I had to leave work  early yesterday in the middle of a meeting due to the constant pain and discomfort in my stomach. I woke up feeling worse than yesterday and I'm taking the day off today as well. It's extremely difficult to concentrate on anything other than the stomach discomfort and I can't seem to find any relief except for the evenings.      You routed conversation to Mansouraty, Telford Nab., MD 2 days ago   [name], [name]  Mansouraty, Telford Nab., MD 2 days ago      I think it's also worth noting that the anxiety also returned after I started experiencing vomiting and diarrhea.      You  [name], [name] 2 days ago      We have entered the orders for the stool test that Dr Gary Robbins would like you to complete.    You do not need an appointment.  Our basement lab is open 7:30 am-5 pm.  You do not need to be fasting. The address is [address].  [place] [address].        You 2 days ago       Addended by: Azani Brogdon L on: 12/17/2020 09:23 AM   Modules accepted: Orders       Documentation    You routed conversation to You 2 days ago   [name], [name]  Mansouraty, Telford Nab., MD 2 days ago      And I am actually vomiting a couple times a day in addition to constantly feeling like I need to.      [name], [name]  Mansouraty, Telford Nab., MD 2 days ago      I've been doing well since the last update I provided. However I had a milkshake this past Sunday and that seems to have triggered a recurrence of my symptoms: I constantly feel like I have to vomit, I've had diarrhea, and no appetite.     You  [name], [name] 2 weeks ago      Thank you I will pass this on to Dr Canary Brim, [name]  Mansouraty, Telford Nab., MD 2 weeks ago      This week has gone very well. My energy has returned and I'm sleeping well again. My stools are mostly back to regular (once in morning  and one in the evening). Occasionally, I've had a second stool later in the  morning that was loose.     Spiewak, [name]  Mansouraty, Telford Nab., MD 4 weeks ago      Thank you     You  Kirkland, [name] 4 weeks ago      You should come in to the basement lab and get new kits.  They will let you know how many you will need and how to complete      You  Boxell, [name] 4 weeks ago      Your physician has requested that you come in for stool studies at your convenience.  You do not need an appointment.  Our basement lab is open 7:30 am-5 pm.  You do not need to be fasting. The address is [address].  [place] [address].        Wolin, [name]  Mansouraty, Telford Nab., MD 4 weeks ago      If so, how many stool kits should I complete?     Ramos, [name]  Mansouraty, Telford Nab., MD 4 weeks ago      I have one stool kit I picked up the same day as my visit to your office. I know we had originally planned to test the stool after I got off of Prilosec in 2 months. So should I go ahead and bring in a stool sample asap?     Mansouraty, Telford Nab., MD  You 4 weeks ago     Merlin Golden, please see notation in regards to putting in stool studies that I would like the patient to have if he ends up coming to the lab to get them done.  Thanks.  GM   Routing comment    Mansouraty, Telford Nab., MD 4 weeks ago       Due to the persistence of alteration of bowel habits we will proceed with stool studies Stool culture, stool ova and parasite, C. difficile, fecal elastase, fecal leukocytes Patient can come and pick up stool kit to have these stool studies performed. Hopefully symptoms do not persist and he will not need to do these.  FYI Jaelle Campanile.      Documentation    Mansouraty, Telford Nab., MD  Alumbaugh, [name] 4 weeks ago      Gary Robbins, Thank you for the update.  Sorry to hear about  the dry heaving.  Hopefully this does not persist.  In regards to the looser bowel movements towards the end of the day, I think stool studies are now indicated to be performed if this persists.  You can use Imodium if necessary but if you can come to the lab tomorrow you can pick up some stool kits so that you can go ahead and have some labs completed.  If these issues persist then as we discussed in clinic if the stool studies are unremarkable then you will likely benefit from an upper and lower endoscopy to further evaluate your symptoms.  I hope this is only for short period in time and that things improve quickly. Good luck and good health.  Gary Britain, MD     You routed conversation to Mansouraty, Telford Nab., MD 4 weeks ago   Cavagnaro, [name]  Mansouraty, Telford Nab., MD 4 weeks ago      I've been off the anti diarrhea medication for a few days now. In the morning I have a relatively normal stool. But subsequent stools are loose like diarrhea. I also had another dry heave this  morning, the first since April 14. Still having trouble sleeping despite trying various otc sleeping aids.

## 2020-12-19 NOTE — Telephone Encounter (Signed)
Patty, They must of just snagged that time slot. Go ahead and just plan an upper endoscopy on 1 of those days and if his diarrhea issues persist we will have to do a flexible sigmoidoscopy or colonoscopy on a different day. Thanks. GM

## 2020-12-19 NOTE — Telephone Encounter (Signed)
Patty, Thanks for the update. I spoke with Dr. Myrtie Neither as well. Go ahead and place the patient on for a 730 procedure date Next 1 that I have available on a morning [facility] slot.  EGD. Thanks. GM

## 2020-12-19 NOTE — Telephone Encounter (Signed)
Mansouraty, Netty Starring., MD  Loretha Stapler, RN Nastasia Kage,  I think June 10 could potentially work then since I should have a 4/430 slot if I am not incorrect.  Let me know if that is possible.  Thanks.  GM

## 2020-12-19 NOTE — Telephone Encounter (Signed)
[facility] will not let me add another case to either day.

## 2020-12-19 NOTE — Telephone Encounter (Signed)
Dr Meridee Score [facility] says that EGD flex can not be on 6/2 at 4:30 pm. Please advise

## 2020-12-20 NOTE — Telephone Encounter (Signed)
12/31/20 at 730 am appt in the [facility] for EGD.   EGD scheduled, pt instructed and medications reviewed.  Patient instructions mailed to home.  Patient to call with any questions or concerns.

## 2020-12-23 LAB — STOOL CULTURE: E coli, Shiga toxin Assay: NEGATIVE

## 2020-12-28 LAB — OVA AND PARASITE EXAMINATION
CONCENTRATE RESULT:: NONE SEEN
MICRO NUMBER:: 11933585
SPECIMEN QUALITY:: ADEQUATE
TRICHROME RESULT:: NONE SEEN

## 2020-12-28 LAB — CLOSTRIDIUM DIFFICILE TOXIN B, QUALITATIVE, REAL-TIME PCR: Toxigenic C. Difficile by PCR: NOT DETECTED

## 2020-12-28 LAB — PANCREATIC ELASTASE, FECAL: Pancreatic Elastase-1, Stool: >500 mcg/g

## 2020-12-28 LAB — FECAL LEUKOCYTES
Micro Number: 11933584
Result: NOT DETECTED
Specimen Quality: ADEQUATE

## 2020-12-31 ENCOUNTER — Encounter: Payer: Self-pay | Admitting: Gastroenterology

## 2020-12-31 ENCOUNTER — Other Ambulatory Visit: Payer: Self-pay

## 2020-12-31 ENCOUNTER — Ambulatory Visit (AMBULATORY_SURGERY_CENTER): Payer: No Typology Code available for payment source | Admitting: Gastroenterology

## 2020-12-31 VITALS — BP 101/69 | HR 52 | Temp 98.3°F | Resp 13 | Ht 67.0 in | Wt 151.0 lb

## 2020-12-31 DIAGNOSIS — R112 Nausea with vomiting, unspecified: Secondary | ICD-10-CM

## 2020-12-31 DIAGNOSIS — K317 Polyp of stomach and duodenum: Secondary | ICD-10-CM

## 2020-12-31 DIAGNOSIS — K2980 Duodenitis without bleeding: Secondary | ICD-10-CM

## 2020-12-31 DIAGNOSIS — K219 Gastro-esophageal reflux disease without esophagitis: Secondary | ICD-10-CM | POA: Diagnosis not present

## 2020-12-31 MED ORDER — SODIUM CHLORIDE 0.9 % IV SOLN: 500.0000 mL | Freq: Once | INTRAVENOUS | Status: DC

## 2020-12-31 NOTE — Progress Notes (Signed)
J.K. vital signs. 

## 2020-12-31 NOTE — Progress Notes (Signed)
Pt's states no medical or surgical changes since previsit or office visit. 

## 2020-12-31 NOTE — Op Note (Signed)
[facility] Patient Name: Gary Robbins Procedure Date: 12/31/2020 7:21 AM MRN: [id number] Endoscopist: Justice Britain , MD Age: 35 Referring MD:  Date of Birth: 1986-01-20 Gender: Male Account #: [id number] Procedure:                Upper GI endoscopy Indications:              Epigastric abdominal pain, Diarrhea (improved                            currently and negative stool studies), Nausea with                            vomiting, Persistent vomiting of unknown cause Medicines:                Monitored Anesthesia Care Procedure:                Pre-Anesthesia Assessment:                           - Prior to the procedure, a History and Physical                            was performed, and patient medications and                            allergies were reviewed. The patient's tolerance of                            previous anesthesia was also reviewed. The risks                            and benefits of the procedure and the sedation                            options and risks were discussed with the patient.                            All questions were answered, and informed consent                            was obtained. Prior Anticoagulants: The patient has                            taken no previous anticoagulant or antiplatelet                            agents. ASA Grade Assessment: II - A patient with                            mild systemic disease. After reviewing the risks                            and benefits, the patient was deemed in  satisfactory condition to undergo the procedure.                           After obtaining informed consent, the endoscope was                            passed under direct vision. Throughout the                            procedure, the patient's blood pressure, pulse, and                            oxygen saturations were monitored continuously. The                             Endoscope was introduced through the mouth, and                            advanced to the second part of duodenum. The upper                            GI endoscopy was accomplished without difficulty.                            The patient tolerated the procedure. Scope In: Scope Out: Findings:                 No gross lesions were noted in the entire                            esophagus. Biopsies were taken with a cold forceps                            for histology to rule out EoE/LoE.                           The Z-line was regular and was found 40 cm from the                            incisors.                           Multiple small semi-sessile polyps with no bleeding                            and no stigmata of recent bleeding were found in                            the cardia and in the gastric body. A few of these                            were biopsied off with a cold forceps for histology  to rule out adenoma.                           Patchy mildly erythematous mucosa without bleeding                            was found in the gastric body.                           No other gross lesions were noted in the entire                            examined stomach. Biopsies were taken with a cold                            forceps for histology and Helicobacter pylori                            testing.                           No gross lesions were noted in the duodenal bulb,                            in the first portion of the duodenum and in the                            second portion of the duodenum. Biopsies were taken                            with a cold forceps for histology to rule out                            enteropathy. Complications:            No immediate complications. Estimated Blood Loss:     Estimated blood loss was minimal. Impression:               - No gross lesions in esophagus. Biopsied.                           -  Z-line regular, 40 cm from the incisors.                           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                            Biopsied.                           - Erythematous mucosa in the gastric body. No other                            gross lesions in the stomach. Biopsied.                           -  No gross lesions in the duodenal bulb, in the                            first portion of the duodenum and in the second                            portion of the duodenum. Biopsied. Recommendation:           - The patient will be observed post-procedure,                            until all discharge criteria are met.                           - Discharge patient to home.                           - Patient has a contact number available for                            emergencies. The signs and symptoms of potential                            delayed complications were discussed with the                            patient. Return to normal activities tomorrow.                            Written discharge instructions were provided to the                            patient.                           - Resume previous diet.                           - Continue present medications.                           - Await pathology results.                           - Continue with outside therapies as you have begun.                           - The findings and recommendations were discussed                            with the patient.                           - The findings and recommendations were discussed  with the patient's family. Justice Britain, MD 12/31/2020 7:56:40 AM

## 2020-12-31 NOTE — Progress Notes (Signed)
A/ox3, pleased with MAC, report to RN 

## 2020-12-31 NOTE — Progress Notes (Signed)
Called to room to assist during endoscopic procedure.  Patient ID and intended procedure confirmed with present staff. Received instructions for my participation in the procedure from the performing physician.  

## 2020-12-31 NOTE — Patient Instructions (Signed)
YOU HAD AN ENDOSCOPIC PROCEDURE TODAY AT THE  ENDOSCOPY CENTER:   Refer to the procedure report that was given to you for any specific questions about what was found during the examination.  If the procedure report does not answer your questions, please call your gastroenterologist to clarify.  If you requested that your care partner not be given the details of your procedure findings, then the procedure report has been included in a sealed envelope for you to review at your convenience later.  YOU SHOULD EXPECT: Some feelings of bloating in the abdomen. Passage of more gas than usual.  Walking can help get rid of the air that was put into your GI tract during the procedure and reduce the bloating. If you had a lower endoscopy (such as a colonoscopy or flexible sigmoidoscopy) you may notice spotting of blood in your stool or on the toilet paper. If you underwent a bowel prep for your procedure, you may not have a normal bowel movement for a few days.  Please Note:  You might notice some irritation and congestion in your nose or some drainage.  This is from the oxygen used during your procedure.  There is no need for concern and it should clear up in a day or so.  SYMPTOMS TO REPORT IMMEDIATELY:    Following upper endoscopy (EGD)  Vomiting of blood or coffee ground material  New chest pain or pain under the shoulder blades  Painful or persistently difficult swallowing  New shortness of breath  Fever of 100F or higher  Black, tarry-looking stools  For urgent or emergent issues, a gastroenterologist can be reached at any hour by calling [phone]. Do not use MyChart messaging for urgent concerns.    DIET:  We do recommend a small meal at first, but then you may proceed to your regular diet.  Drink plenty of fluids but you should avoid alcoholic beverages for 24 hours.  ACTIVITY:  You should plan to take it easy for the rest of today and you should NOT DRIVE or use heavy machinery  until tomorrow (because of the sedation medicines used during the test).    FOLLOW UP: Our staff will call the number listed on your records 48-72 hours following your procedure to check on you and address any questions or concerns that you may have regarding the information given to you following your procedure. If we do not reach you, we will leave a message.  We will attempt to reach you two times.  During this call, we will ask if you have developed any symptoms of COVID 19. If you develop any symptoms (ie: fever, flu-like symptoms, shortness of breath, cough etc.) before then, please call [phone].  If you test positive for Covid 19 in the 2 weeks post procedure, please call and report this information to [place].    If any biopsies were taken you will be contacted by phone or by letter within the next 1-3 weeks.  Please call us at [phone] if you have not heard about the biopsies in 3 weeks.    SIGNATURES/CONFIDENTIALITY: You and/or your care partner have signed paperwork which will be entered into your electronic medical record.  These signatures attest to the fact that that the information above on your After Visit Summary has been reviewed and is understood.  Full responsibility of the confidentiality of this discharge information lies with you and/or your care-partner.   Resume medications. Information given on polyps.

## 2021-01-02 ENCOUNTER — Telehealth: Payer: Self-pay | Admitting: *Deleted

## 2021-01-02 NOTE — Telephone Encounter (Signed)
  Follow up Call-  Call back number 12/31/2020  Post procedure Call Back phone  # [phone]  Permission to leave phone message Yes  Some recent data might be hidden     Patient questions:  Do you have a fever, pain , or abdominal swelling? No. Pain Score  0 *  Have you tolerated food without any problems? Yes.    Have you been able to return to your normal activities? Yes.    Do you have any questions about your discharge instructions: Diet   No. Medications  No. Follow up visit  No.  Do you have questions or concerns about your Care? No.  Actions: * If pain score is 4 or above: No action needed, pain <4.  Covid-19 screening questions   Do you now or have you had a fever in the last 14 days?no  Do you have any respiratory symptoms of shortness of breath or cough now or in the last 14 days?no  Do you have any family members or close contacts with diagnosed or suspected Covid-19 in the past 14 days?no  Have you been tested for Covid-19 and found to be positive? no

## 2021-01-03 ENCOUNTER — Encounter: Payer: Self-pay | Admitting: Gastroenterology

## 2021-01-09 ENCOUNTER — Ambulatory Visit: Payer: No Typology Code available for payment source | Admitting: Psychology

## 2021-01-23 ENCOUNTER — Ambulatory Visit: Payer: No Typology Code available for payment source | Admitting: Psychology

## 2021-02-06 ENCOUNTER — Ambulatory Visit: Payer: No Typology Code available for payment source | Admitting: Psychology

## 2021-03-06 ENCOUNTER — Other Ambulatory Visit: Payer: Self-pay | Admitting: Physician Assistant

## 2021-03-06 ENCOUNTER — Encounter: Payer: Self-pay | Admitting: Physician Assistant

## 2021-03-06 ENCOUNTER — Ambulatory Visit: Payer: 59 | Admitting: Physician Assistant

## 2021-03-06 ENCOUNTER — Other Ambulatory Visit: Payer: Self-pay

## 2021-03-06 ENCOUNTER — Ambulatory Visit (INDEPENDENT_AMBULATORY_CARE_PROVIDER_SITE_OTHER): Payer: No Typology Code available for payment source | Admitting: Physician Assistant

## 2021-03-06 VITALS — BP 110/71 | HR 67 | Temp 98.4°F | Ht 67.0 in | Wt 151.2 lb

## 2021-03-06 DIAGNOSIS — R5383 Other fatigue: Secondary | ICD-10-CM | POA: Diagnosis not present

## 2021-03-06 DIAGNOSIS — K219 Gastro-esophageal reflux disease without esophagitis: Secondary | ICD-10-CM

## 2021-03-06 DIAGNOSIS — F410 Panic disorder [episodic paroxysmal anxiety] without agoraphobia: Secondary | ICD-10-CM

## 2021-03-06 DIAGNOSIS — F411 Generalized anxiety disorder: Secondary | ICD-10-CM | POA: Diagnosis not present

## 2021-03-06 LAB — VITAMIN D 25 HYDROXY (VIT D DEFICIENCY, FRACTURES): VITD: 22.79 ng/mL — ABNORMAL LOW (ref 30.00–100.00)

## 2021-03-06 LAB — TSH: TSH: 2.54 u[IU]/mL (ref 0.35–5.50)

## 2021-03-06 MED ORDER — VITAMIN D (ERGOCALCIFEROL) 1.25 MG (50000 UNIT) PO CAPS: 50000.0000 [IU] | ORAL_CAPSULE | ORAL | 0 refills | Status: DC

## 2021-03-06 NOTE — Progress Notes (Signed)
New Patient Office Visit  Subjective:  Patient ID: Gary Robbins, male    DOB: Sep 19, 1985  Age: 35 y.o. MRN: [id number]  CC:  Chief Complaint  Patient presents with   Vitamin D Levels    HPI Gary Robbins presents for new patient establishment.  He does not have any acute concerns today except that he would like his vitamin D level checked and also thyroid level checked, which was recommended to him by his psychiatrist.  Overall fairly healthy individual.  He explains to me that earlier this year he started to have GI virus symptoms.  When the vomiting continued longer than his family's, he went through several series of testing and ultimately it was decided that he was having vomiting as a result of anxiety.  He states that he did complete a 6-week outpatient treatment at [facility] and is doing much better.  He says that he was undergoing a lot of stress that he had bottled up for a while with his sister moving out to [place] and some other things going on in his marriage.  He is still seeing a psychologist and continues to make progress.  He has a prescription for hydroxyzine 10 mg up to 4 times daily that he does take as needed.  He is also taking Prilosec 20 mg for some reflux issues.  Past Medical History:  Diagnosis Date   Allergy    Anxiety    Depression     Past Surgical History:  Procedure Laterality Date   GUM SURGERY     UPPER GI ENDOSCOPY      Family History  Problem Relation Age of Onset   Asthma Mother    Depression Mother    Hyperlipidemia Mother    Bipolar disorder Mother    Hearing loss Father    Depression Maternal Grandmother    Dementia Maternal Grandmother    Arthritis Maternal Grandmother    Glaucoma Maternal Grandmother    Prostate cancer Maternal Grandfather    Bone cancer Maternal Grandfather    Heart disease Paternal Grandfather    Hyperlipidemia Paternal Grandfather    Breast cancer Other        maternal great aunts   Diabetes  Maternal Great-grandmother    Heart disease Maternal Great-grandmother    Colon cancer Neg Hx    Esophageal cancer Neg Hx    Inflammatory bowel disease Neg Hx    Liver disease Neg Hx    Pancreatic cancer Neg Hx    Rectal cancer Neg Hx    Stomach cancer Neg Hx     Social History   Socioeconomic History   Marital status: Married    Spouse name: Not on file   Number of children: 1   Years of education: Not on file   Highest education level: Not on file  Occupational History   Occupation: Scientist, research (life sciences)   Occupation:    Tobacco Use   Smoking status: Never   Smokeless tobacco: Never  Vaping Use   Vaping Use: Never used  Substance and Sexual Activity   Alcohol use: Not Currently    Comment: OCCASIONAL once a month   Drug use: No   Sexual activity: Not on file  Other Topics Concern   Not on file  Social History Narrative   Not on file   Social Determinants of Health   Financial Resource Strain: Not on file  Food Insecurity: Not on file  Transportation Needs: Not on file  Physical Activity: Not on file  Stress: Not on file  Social Connections: Not on file  Intimate Partner Violence: Not on file    ROS Review of Systems  Constitutional:  Negative for activity change, appetite change and unexpected weight change.  HENT:  Negative for congestion.   Eyes:  Negative for visual disturbance.  Respiratory:  Negative for apnea and shortness of breath.   Cardiovascular:  Negative for chest pain.  Gastrointestinal:  Negative for abdominal pain and blood in stool.  Endocrine: Negative for polydipsia, polyphagia and polyuria.  Genitourinary:  Negative for difficulty urinating.  Musculoskeletal:  Negative for arthralgias.  Skin:  Negative for rash.  Neurological:  Negative for seizures, weakness and headaches.  Psychiatric/Behavioral:  Negative for sleep disturbance and suicidal ideas.    Objective:   Today's Vitals: BP 110/71   Pulse 67   Temp 98.4 F (36.9 C)    Ht 5\' 7"  (1.702 m)   Wt 151 lb 4 oz (68.6 kg)   SpO2 99%   BMI 23.69 kg/m   Physical Exam Vitals and nursing note reviewed.  Constitutional:      General: He is not in acute distress.    Appearance: Normal appearance. He is not toxic-appearing.  HENT:     Head: Normocephalic and atraumatic.     Right Ear: External ear normal.     Left Ear: External ear normal.     Nose: Nose normal.     Mouth/Throat:     Mouth: Mucous membranes are moist.     Pharynx: Oropharynx is clear.  Eyes:     Extraocular Movements: Extraocular movements intact.     Conjunctiva/sclera: Conjunctivae normal.     Pupils: Pupils are equal, round, and reactive to light.  Cardiovascular:     Rate and Rhythm: Normal rate and regular rhythm.     Pulses: Normal pulses.     Heart sounds: Normal heart sounds.  Pulmonary:     Effort: Pulmonary effort is normal.     Breath sounds: Normal breath sounds.  Abdominal:     Tenderness: There is no abdominal tenderness.  Musculoskeletal:        General: Normal range of motion.     Cervical back: Normal range of motion and neck supple.  Skin:    General: Skin is warm and dry.  Neurological:     General: No focal deficit present.     Mental Status: He is alert and oriented to person, place, and time.  Psychiatric:        Mood and Affect: Mood normal.        Behavior: Behavior normal.    Assessment & Plan:   Problem List Items Addressed This Visit   None Visit Diagnoses     Generalized anxiety disorder with panic attacks    -  Primary   Relevant Orders   TSH (Completed)   VITAMIN D 25 Hydroxy (Vit-D Deficiency, Fractures) (Completed)   Gastroesophageal reflux disease without esophagitis       Other fatigue       Relevant Orders   TSH (Completed)   VITAMIN D 25 Hydroxy (Vit-D Deficiency, Fractures) (Completed)       Outpatient Encounter Medications as of 03/06/2021  Medication Sig   hydrOXYzine (ATARAX/VISTARIL) 10 MG tablet Take 10 mg by mouth 4 (four)  times daily as needed.   omeprazole (PRILOSEC) 20 MG capsule Take 1 capsule (20 mg total) by mouth daily.   [DISCONTINUED] amitriptyline (ELAVIL) 25 MG tablet Take 25 mg by mouth at bedtime.   [  DISCONTINUED] dicyclomine (BENTYL) 20 MG tablet Take 1 tablet (20 mg total) by mouth 2 (two) times daily.   [DISCONTINUED] ondansetron (ZOFRAN ODT) 4 MG disintegrating tablet Take 1 tablet (4 mg total) by mouth every 8 (eight) hours as needed for nausea or vomiting.   [DISCONTINUED] promethazine (PHENERGAN) 12.5 MG tablet Take 12.5 mg by mouth every 6 (six) hours as needed.   No facility-administered encounter medications on file as of 03/06/2021.    Follow-up: Return in about 6 months (around 09/06/2021) for CPE and fasting labs .   Plan: New patient establishment.  He is doing much better as far as his anxiety is concerned.  He is going to continue seeing psychology, but he says that he no longer sees the psychiatrist.  I informed him that I would refill his hydroxyzine if he does need it to be continued.  Also agreed to check vitamin D and thyroid level today.  Jamesmichael Shadd M Brittan Butterbaugh, PA-C

## 2021-03-06 NOTE — Patient Instructions (Signed)
Good to meet you today! Please go to the lab for blood work and I will send results through MyChart. Keep up good work with healthy lifestyle.  Call if any concerns  

## 2021-03-07 ENCOUNTER — Encounter: Payer: Self-pay | Admitting: Physician Assistant

## 2021-03-28 MED ORDER — VITAMIN D (ERGOCALCIFEROL) 1.25 MG (50000 UNIT) PO CAPS: 50000.0000 [IU] | ORAL_CAPSULE | ORAL | 0 refills | Status: DC

## 2021-04-07 ENCOUNTER — Ambulatory Visit: Payer: No Typology Code available for payment source | Admitting: Physician Assistant

## 2021-05-20 ENCOUNTER — Telehealth: Payer: Self-pay

## 2021-05-20 ENCOUNTER — Other Ambulatory Visit: Payer: Self-pay

## 2021-05-20 DIAGNOSIS — E559 Vitamin D deficiency, unspecified: Secondary | ICD-10-CM

## 2021-05-20 MED ORDER — VITAMIN D (ERGOCALCIFEROL) 1.25 MG (50000 UNIT) PO CAPS: 50000.0000 [IU] | ORAL_CAPSULE | ORAL | 0 refills | Status: AC

## 2021-05-20 NOTE — Telephone Encounter (Signed)
Left voice message for patient to call clinic.  

## 2021-05-20 NOTE — Telephone Encounter (Signed)
Lab placed for recheck of vitamin D

## 2021-05-20 NOTE — Telephone Encounter (Signed)
Pt called stating that he saw Gary Robbins on 8/11 and his Vitamin D was low. Ruhaan stated that Gary Robbins wanted him to come in by the end of the year to recheck his Vitamin D. Can labs be ordered? Please Advise.

## 2021-05-21 ENCOUNTER — Telehealth: Payer: Self-pay

## 2021-05-21 ENCOUNTER — Other Ambulatory Visit (INDEPENDENT_AMBULATORY_CARE_PROVIDER_SITE_OTHER): Payer: No Typology Code available for payment source

## 2021-05-21 ENCOUNTER — Other Ambulatory Visit: Payer: Self-pay

## 2021-05-21 DIAGNOSIS — E559 Vitamin D deficiency, unspecified: Secondary | ICD-10-CM

## 2021-05-21 LAB — VITAMIN D 25 HYDROXY (VIT D DEFICIENCY, FRACTURES): VITD: 29.03 ng/mL — ABNORMAL LOW (ref 30.00–100.00)

## 2021-05-21 NOTE — Telephone Encounter (Signed)
LVM for pt to call the office back.  °

## 2021-05-21 NOTE — Telephone Encounter (Signed)
Error

## 2021-05-25 ENCOUNTER — Encounter: Payer: Self-pay | Admitting: Physician Assistant

## 2021-09-09 ENCOUNTER — Other Ambulatory Visit: Payer: Self-pay

## 2021-09-09 ENCOUNTER — Telehealth: Payer: Self-pay | Admitting: Physician Assistant

## 2021-09-09 MED ORDER — HYDROXYZINE HCL 10 MG PO TABS: 10.0000 mg | ORAL_TABLET | Freq: Four times a day (QID) | ORAL | 1 refills | Status: DC | PRN

## 2021-09-09 NOTE — Telephone Encounter (Signed)
Patient needs a refill for hydrOXYzine (ATARAX/VISTARIL) 10 MG tablet  Does patient need an OV for this refill?   Please send refill to [facility] on [address] [employer]- [address] location)

## 2021-09-09 NOTE — Telephone Encounter (Signed)
Rx sent in

## 2021-09-10 ENCOUNTER — Encounter: Payer: No Typology Code available for payment source | Admitting: Physician Assistant

## 2021-09-17 ENCOUNTER — Other Ambulatory Visit: Payer: Self-pay

## 2021-09-17 ENCOUNTER — Ambulatory Visit (INDEPENDENT_AMBULATORY_CARE_PROVIDER_SITE_OTHER): Payer: No Typology Code available for payment source | Admitting: Physician Assistant

## 2021-09-17 VITALS — BP 117/76 | HR 75 | Temp 97.8°F | Ht 67.0 in | Wt 162.2 lb

## 2021-09-17 DIAGNOSIS — Z0000 Encounter for general adult medical examination without abnormal findings: Secondary | ICD-10-CM

## 2021-09-17 DIAGNOSIS — Z23 Encounter for immunization: Secondary | ICD-10-CM | POA: Diagnosis not present

## 2021-09-17 NOTE — Progress Notes (Signed)
Subjective:    Patient ID: Gary Robbins, male    DOB: August 12, 1985, 36 y.o.   MRN: [id number]  Chief Complaint  Patient presents with   Annual Exam    HPI 36 yo male presents today for annual exam.   Acute concerns: None   Health maintenance: Lifestyle/ exercise: Used to walk 3 miles daily before winter Nutrition: Doing well - healthy, mediterranean  Mental health: Hydroxyzine 10 mg every morning has been helping anxiety and depression  Sleep: Doing well, not interrupted Substance use: None ETOH: None Sexual activity: Monogamous with wife  Immunizations: Tdap today Colonoscopy: Will do at age 68     Past Medical History:  Diagnosis Date   Allergy    Anxiety    Depression     Past Surgical History:  Procedure Laterality Date   GUM SURGERY     UPPER GI ENDOSCOPY      Family History  Problem Relation Age of Onset   Asthma Mother    Depression Mother    Hyperlipidemia Mother    Bipolar disorder Mother    Hearing loss Father    Depression Maternal Grandmother    Dementia Maternal Grandmother    Arthritis Maternal Grandmother    Glaucoma Maternal Grandmother    Prostate cancer Maternal Grandfather    Bone cancer Maternal Grandfather    Heart disease Paternal Grandfather    Hyperlipidemia Paternal Grandfather    Breast cancer Other        maternal great aunts   Diabetes Maternal Great-grandmother    Heart disease Maternal Great-grandmother    Colon cancer Neg Hx    Esophageal cancer Neg Hx    Inflammatory bowel disease Neg Hx    Liver disease Neg Hx    Pancreatic cancer Neg Hx    Rectal cancer Neg Hx    Stomach cancer Neg Hx     Social History   Tobacco Use   Smoking status: Never   Smokeless tobacco: Never  Vaping Use   Vaping Use: Never used  Substance Use Topics   Alcohol use: Not Currently    Comment: OCCASIONAL once a month   Drug use: No     No Known Allergies  Review of Systems NEGATIVE UNLESS OTHERWISE INDICATED IN HPI       Objective:     BP 117/76    Pulse 75    Temp 97.8 F (36.6 C)    Ht 5\' 7"  (1.702 m)    Wt 162 lb 4 oz (73.6 kg)    SpO2 98%    BMI 25.41 kg/m   Wt Readings from Last 3 Encounters:  09/17/21 162 lb 4 oz (73.6 kg)  03/06/21 151 lb 4 oz (68.6 kg)  12/31/20 151 lb (68.5 kg)    BP Readings from Last 3 Encounters:  09/17/21 117/76  03/06/21 110/71  12/31/20 101/69     Physical Exam Vitals and nursing note reviewed.  Constitutional:      General: He is not in acute distress.    Appearance: Normal appearance. He is not toxic-appearing.  HENT:     Head: Normocephalic and atraumatic.     Right Ear: Tympanic membrane, ear canal and external ear normal.     Left Ear: Tympanic membrane, ear canal and external ear normal.     Nose: Nose normal.     Mouth/Throat:     Mouth: Mucous membranes are moist.     Pharynx: Oropharynx is clear.  Eyes:  Extraocular Movements: Extraocular movements intact.     Conjunctiva/sclera: Conjunctivae normal.     Pupils: Pupils are equal, round, and reactive to light.  Cardiovascular:     Rate and Rhythm: Normal rate and regular rhythm.     Pulses: Normal pulses.     Heart sounds: Normal heart sounds.  Pulmonary:     Effort: Pulmonary effort is normal.     Breath sounds: Normal breath sounds.  Abdominal:     General: Abdomen is flat. Bowel sounds are normal.     Palpations: Abdomen is soft.     Tenderness: There is no abdominal tenderness.  Musculoskeletal:        General: Normal range of motion.     Cervical back: Normal range of motion and neck supple.  Skin:    General: Skin is warm and dry.  Neurological:     General: No focal deficit present.     Mental Status: He is alert and oriented to person, place, and time.  Psychiatric:        Mood and Affect: Mood normal.        Behavior: Behavior normal.       Assessment & Plan:   Problem List Items Addressed This Visit   None Visit Diagnoses     Encounter for annual physical exam     -  Primary   Relevant Orders   CBC with Differential/Platelet   Comprehensive metabolic panel   Lipid panel   Need for Tdap vaccination       Relevant Orders   Tdap vaccine greater than or equal to 7yo IM       Plan: -Age-appropriate screening and counseling performed today. Will check labs and call with results. Preventive measures discussed and printed in AVS for patient.  -Tdap updated today -Doing great with lifestyle  F/up 6 months   Gabrielle Wakeland M Nadalyn Deringer, PA-C

## 2021-09-17 NOTE — Patient Instructions (Signed)
Good to see you today! Keep up the good work! Please schedule for fasting labs.  Also need recheck on medications in about 6 months.  Call sooner if any concerns.

## 2021-10-07 ENCOUNTER — Other Ambulatory Visit (INDEPENDENT_AMBULATORY_CARE_PROVIDER_SITE_OTHER): Payer: No Typology Code available for payment source

## 2021-10-07 DIAGNOSIS — Z0000 Encounter for general adult medical examination without abnormal findings: Secondary | ICD-10-CM

## 2021-10-07 LAB — COMPREHENSIVE METABOLIC PANEL WITH GFR
ALT: 166 U/L — ABNORMAL HIGH (ref 0–53)
AST: 697 U/L — ABNORMAL HIGH (ref 0–37)
Albumin: 4.5 g/dL (ref 3.5–5.2)
Alkaline Phosphatase: 78 U/L (ref 39–117)
BUN: 12 mg/dL (ref 6–23)
CO2: 31 meq/L (ref 19–32)
Calcium: 9.5 mg/dL (ref 8.4–10.5)
Chloride: 102 meq/L (ref 96–112)
Creatinine, Ser: 0.72 mg/dL (ref 0.40–1.50)
GFR: 117.96 mL/min
Glucose, Bld: 92 mg/dL (ref 70–99)
Potassium: 4.2 meq/L (ref 3.5–5.1)
Sodium: 138 meq/L (ref 135–145)
Total Bilirubin: 0.6 mg/dL (ref 0.2–1.2)
Total Protein: 7 g/dL (ref 6.0–8.3)

## 2021-10-07 LAB — CBC WITH DIFFERENTIAL/PLATELET
Basophils Absolute: 0 10*3/uL (ref 0.0–0.1)
Basophils Relative: 0.5 % (ref 0.0–3.0)
Eosinophils Absolute: 0.1 10*3/uL (ref 0.0–0.7)
Eosinophils Relative: 2.4 % (ref 0.0–5.0)
HCT: 44.4 % (ref 39.0–52.0)
Hemoglobin: 15.2 g/dL (ref 13.0–17.0)
Lymphocytes Relative: 23.8 % (ref 12.0–46.0)
Lymphs Abs: 1.3 10*3/uL (ref 0.7–4.0)
MCHC: 34.2 g/dL (ref 30.0–36.0)
MCV: 89.4 fl (ref 78.0–100.0)
Monocytes Absolute: 0.5 10*3/uL (ref 0.1–1.0)
Monocytes Relative: 8.6 % (ref 3.0–12.0)
Neutro Abs: 3.5 10*3/uL (ref 1.4–7.7)
Neutrophils Relative %: 64.7 % (ref 43.0–77.0)
Platelets: 250 10*3/uL (ref 150.0–400.0)
RBC: 4.96 Mil/uL (ref 4.22–5.81)
RDW: 13.1 % (ref 11.5–15.5)
WBC: 5.5 10*3/uL (ref 4.0–10.5)

## 2021-10-07 LAB — LIPID PANEL
Cholesterol: 203 mg/dL — ABNORMAL HIGH (ref 0–200)
HDL: 44.4 mg/dL
LDL Cholesterol: 121 mg/dL — ABNORMAL HIGH (ref 0–99)
NonHDL: 158.19
Total CHOL/HDL Ratio: 5
Triglycerides: 187 mg/dL — ABNORMAL HIGH (ref 0.0–149.0)
VLDL: 37.4 mg/dL (ref 0.0–40.0)

## 2021-10-08 ENCOUNTER — Other Ambulatory Visit: Payer: Self-pay

## 2021-10-08 ENCOUNTER — Inpatient Hospital Stay (HOSPITAL_BASED_OUTPATIENT_CLINIC_OR_DEPARTMENT_OTHER)
Admission: EM | Admit: 2021-10-08 | Discharge: 2021-10-11 | DRG: 558 | Disposition: A | Discharge status: Home / Self Care | Payer: No Typology Code available for payment source | Attending: Family Medicine | Admitting: Family Medicine

## 2021-10-08 ENCOUNTER — Other Ambulatory Visit: Payer: No Typology Code available for payment source

## 2021-10-08 ENCOUNTER — Ambulatory Visit (INDEPENDENT_AMBULATORY_CARE_PROVIDER_SITE_OTHER): Payer: No Typology Code available for payment source | Admitting: Physician Assistant

## 2021-10-08 ENCOUNTER — Encounter (HOSPITAL_BASED_OUTPATIENT_CLINIC_OR_DEPARTMENT_OTHER): Payer: Self-pay

## 2021-10-08 ENCOUNTER — Encounter: Payer: Self-pay | Admitting: Physician Assistant

## 2021-10-08 VITALS — BP 104/66 | HR 92 | Temp 98.8°F | Ht 67.0 in | Wt 165.2 lb

## 2021-10-08 DIAGNOSIS — Z83438 Family history of other disorder of lipoprotein metabolism and other lipidemia: Secondary | ICD-10-CM

## 2021-10-08 DIAGNOSIS — Z20822 Contact with and (suspected) exposure to covid-19: Secondary | ICD-10-CM | POA: Diagnosis present

## 2021-10-08 DIAGNOSIS — R7989 Other specified abnormal findings of blood chemistry: Secondary | ICD-10-CM

## 2021-10-08 DIAGNOSIS — R748 Abnormal levels of other serum enzymes: Secondary | ICD-10-CM

## 2021-10-08 DIAGNOSIS — Z833 Family history of diabetes mellitus: Secondary | ICD-10-CM

## 2021-10-08 DIAGNOSIS — Z8042 Family history of malignant neoplasm of prostate: Secondary | ICD-10-CM

## 2021-10-08 DIAGNOSIS — M6282 Rhabdomyolysis: Secondary | ICD-10-CM | POA: Diagnosis not present

## 2021-10-08 DIAGNOSIS — F419 Anxiety disorder, unspecified: Secondary | ICD-10-CM | POA: Diagnosis present

## 2021-10-08 DIAGNOSIS — Z8249 Family history of ischemic heart disease and other diseases of the circulatory system: Secondary | ICD-10-CM

## 2021-10-08 DIAGNOSIS — E876 Hypokalemia: Secondary | ICD-10-CM | POA: Diagnosis present

## 2021-10-08 LAB — URINALYSIS, ROUTINE W REFLEX MICROSCOPIC
Bilirubin Urine: NEGATIVE
Bilirubin Urine: NEGATIVE
Glucose, UA: NEGATIVE mg/dL
Ketones, ur: NEGATIVE
Ketones, ur: NEGATIVE mg/dL
Leukocytes,Ua: NEGATIVE
Leukocytes,Ua: NEGATIVE
Nitrite: NEGATIVE
Nitrite: NEGATIVE
Protein, ur: NEGATIVE mg/dL
Specific Gravity, Urine: <=1.005 — AB (ref 1.000–1.030)
Specific Gravity, Urine: 1.009 (ref 1.005–1.030)
Total Protein, Urine: NEGATIVE
Urine Glucose: NEGATIVE
Urobilinogen, UA: 0.2 (ref 0.0–1.0)
WBC, UA: NONE SEEN (ref 0–?)
pH: 6 (ref 5.0–8.0)
pH: 5.5 (ref 5.0–8.0)

## 2021-10-08 LAB — COMPREHENSIVE METABOLIC PANEL
ALT: 172 U/L — ABNORMAL HIGH (ref 0–44)
AST: 524 U/L — ABNORMAL HIGH (ref 15–41)
Albumin: 4.3 g/dL (ref 3.5–5.0)
Alkaline Phosphatase: 67 U/L (ref 38–126)
Anion gap: 7 (ref 5–15)
BUN: 14 mg/dL (ref 6–20)
CO2: 27 mmol/L (ref 22–32)
Calcium: 9.6 mg/dL (ref 8.9–10.3)
Chloride: 103 mmol/L (ref 98–111)
Creatinine, Ser: 0.67 mg/dL (ref 0.61–1.24)
GFR, Estimated: >60 mL/min (ref >60)
Glucose, Bld: 110 mg/dL — ABNORMAL HIGH (ref 70–99)
Potassium: 3.4 mmol/L — ABNORMAL LOW (ref 3.5–5.1)
Sodium: 137 mmol/L (ref 135–145)
Total Bilirubin: 0.4 mg/dL (ref 0.3–1.2)
Total Protein: 7 g/dL (ref 6.5–8.1)

## 2021-10-08 LAB — CBC WITH DIFFERENTIAL/PLATELET
Abs Immature Granulocytes: 0.03 10*3/uL (ref 0.00–0.07)
Basophils Absolute: 0 10*3/uL (ref 0.0–0.1)
Basophils Relative: 0 %
Eosinophils Absolute: 0.2 10*3/uL (ref 0.0–0.5)
Eosinophils Relative: 2 %
HCT: 42.3 % (ref 39.0–52.0)
Hemoglobin: 14.6 g/dL (ref 13.0–17.0)
Immature Granulocytes: 0 %
Lymphocytes Relative: 23 %
Lymphs Abs: 2 10*3/uL (ref 0.7–4.0)
MCH: 29.9 pg (ref 26.0–34.0)
MCHC: 34.5 g/dL (ref 30.0–36.0)
MCV: 86.7 fL (ref 80.0–100.0)
Monocytes Absolute: 0.7 10*3/uL (ref 0.1–1.0)
Monocytes Relative: 7 %
Neutro Abs: 5.9 10*3/uL (ref 1.7–7.7)
Neutrophils Relative %: 68 %
Platelets: 259 10*3/uL (ref 150–400)
RBC: 4.88 MIL/uL (ref 4.22–5.81)
RDW: 12.2 % (ref 11.5–15.5)
WBC: 8.8 10*3/uL (ref 4.0–10.5)
nRBC: 0 % (ref 0.0–0.2)

## 2021-10-08 LAB — HEPATIC FUNCTION PANEL
ALT: 182 U/L — ABNORMAL HIGH (ref 0–53)
AST: 593 U/L — ABNORMAL HIGH (ref 0–37)
Albumin: 4.6 g/dL (ref 3.5–5.2)
Alkaline Phosphatase: 82 U/L (ref 39–117)
Bilirubin, Direct: 0.1 mg/dL (ref 0.0–0.3)
Total Bilirubin: 0.6 mg/dL (ref 0.2–1.2)
Total Protein: 6.8 g/dL (ref 6.0–8.3)

## 2021-10-08 LAB — CK
Total CK: >2000 U/L — ABNORMAL HIGH (ref 7–232)
Total CK: 17490 U/L — ABNORMAL HIGH (ref 49–397)

## 2021-10-08 MED ORDER — SODIUM CHLORIDE 0.9 % IV BOLUS
1000.0000 mL | Freq: Once | INTRAVENOUS | Status: AC
  Administered 2021-10-08: 1000 mL via INTRAVENOUS

## 2021-10-08 MED ORDER — DEXTROSE 5 % IV SOLN
Freq: Once | INTRAVENOUS | Status: AC
Start: 2021-10-08 — End: 2021-10-09
  Filled 2021-10-08: qty 1000

## 2021-10-08 MED ORDER — POTASSIUM CHLORIDE CRYS ER 20 MEQ PO TBCR
40.0000 meq | EXTENDED_RELEASE_TABLET | Freq: Once | ORAL | Status: AC
  Administered 2021-10-09: 40 meq via ORAL
  Filled 2021-10-08: qty 2

## 2021-10-08 NOTE — Patient Instructions (Signed)
It was great to see you! ? ?Push fluids ?Avoid any excessive activity ?We will be in touch with blood work results and recommendations ? ?If any new/unusual symptoms in the meantime, let us know! ? ?Take care, ? ?Jarold Motto PA-C  ?

## 2021-10-08 NOTE — ED Provider Notes (Signed)
?[facility] EM[facility]PT ?Provider Note ? ? ?CSN: [id number] ?Arrival date & time: 10/08/21  2106 ? ?  ? ?History ? ?Chief Complaint  ?Patient presents with  ? Abnormal Lab  ? ? ?Gary Robbins is a 36 y.o. male. ? ?Patient indicates was referred to ED from doctors office. States ~ 9 days ago he went back to working out at gym, did very aggressive leg workout, and subsequently had marked bilateral leg/muscular soreness for a few days. 5 days ago did very intense arm/upper body workout, and had similar severe muscle soreness for a few days post that workout. Went to pcp for routine appt this week and was told ck and liver fxn tests high. Indicates no hx same. Did take tylenol, a dose per day over weekend, but only as prescribed, and never exceeded normal dose. States did drink a single beer over weekend but no heavy etoh use. No other med use or new product use.  Currently feels fine. Denies abd pain or flank pain. No discoloration of urine. No fever or chills. Normal appetite. Normal po intake.  ? ?The history is provided by the patient and medical records.  ?Abnormal Lab ? ?  ? ?Home Medications ?Prior to Admission medications   ?Medication Sig Start Date End Date Taking? Authorizing Provider  ?hydrOXYzine (ATARAX) 10 MG tablet Take 1 tablet (10 mg total) by mouth 4 (four) times daily as needed. Take 10 mg by mouth 4 (four) times daily as needed. 09/09/21   Allwardt, Crist Infante, PA-C  ?   ? ?Allergies    ?Patient has no known allergies.   ? ?Review of Systems   ?Review of Systems  ?Constitutional:  Negative for fever.  ?HENT:  Negative for sore throat.   ?Eyes:  Negative for redness.  ?Respiratory:  Negative for shortness of breath.   ?Cardiovascular:  Negative for chest pain.  ?Gastrointestinal:  Negative for abdominal pain, diarrhea and vomiting.  ?Genitourinary:  Negative for dysuria and hematuria.  ?Musculoskeletal:  Negative for back pain.  ?Skin:  Negative for rash.  ?Neurological:  Negative  for headaches.  ?Hematological:  Does not bruise/bleed easily.  ?Psychiatric/Behavioral:  Negative for confusion.   ? ?Physical Exam ?Updated Vital Signs ?BP (!) 124/91 (BP Location: Right Arm)   Pulse 90   Temp 97.7 ?F (36.5 ?C) (Oral)   Resp 16   Ht 1.702 m (5\' 7" )   Wt 75 kg   SpO2 98%   BMI 25.90 kg/m?  ?Physical Exam ?Vitals and nursing note reviewed.  ?Constitutional:   ?   Appearance: Normal appearance. He is well-developed.  ?HENT:  ?   Head: Atraumatic.  ?   Nose: Nose normal.  ?   Mouth/Throat:  ?   Mouth: Mucous membranes are moist.  ?Eyes:  ?   General: No scleral icterus. ?   Conjunctiva/sclera: Conjunctivae normal.  ?Neck:  ?   Trachea: No tracheal deviation.  ?Cardiovascular:  ?   Rate and Rhythm: Normal rate and regular rhythm.  ?   Pulses: Normal pulses.  ?   Heart sounds: Normal heart sounds. No murmur heard. ?  No friction rub. No gallop.  ?Pulmonary:  ?   Effort: Pulmonary effort is normal. No accessory muscle usage or respiratory distress.  ?   Breath sounds: Normal breath sounds.  ?Abdominal:  ?   General: Bowel sounds are normal. There is no distension.  ?   Palpations: Abdomen is soft.  ?   Tenderness: There is no abdominal  tenderness.  ?Genitourinary: ?   Comments: No cva tenderness. ?Musculoskeletal:  ?   Cervical back: Normal range of motion and neck supple. No rigidity.  ?   Comments: No muscular swelling or tenderness noted. Compartments of arms and legs are soft, not tense.   ?Skin: ?   General: Skin is warm and dry.  ?   Findings: No rash.  ?Neurological:  ?   Mental Status: He is alert.  ?   Comments: Alert, speech clear. Steady gait.   ?Psychiatric:     ?   Mood and Affect: Mood normal.  ? ? ?ED Results / Procedures / Treatments   ?Labs ?(all labs ordered are listed, but only abnormal results are displayed) ?Results for orders placed or performed during the hospital encounter of 10/08/21  ?CBC with Differential/Platelet  ?Result Value Ref Range  ? WBC 8.8 4.0 - 10.5 K/uL  ?  RBC 4.88 4.22 - 5.81 MIL/uL  ? Hemoglobin 14.6 13.0 - 17.0 g/dL  ? HCT 42.3 39.0 - 52.0 %  ? MCV 86.7 80.0 - 100.0 fL  ? MCH 29.9 26.0 - 34.0 pg  ? MCHC 34.5 30.0 - 36.0 g/dL  ? RDW 12.2 11.5 - 15.5 %  ? Platelets 259 150 - 400 K/uL  ? nRBC 0.0 0.0 - 0.2 %  ? Neutrophils Relative % 68 %  ? Neutro Abs 5.9 1.7 - 7.7 K/uL  ? Lymphocytes Relative 23 %  ? Lymphs Abs 2.0 0.7 - 4.0 K/uL  ? Monocytes Relative 7 %  ? Monocytes Absolute 0.7 0.1 - 1.0 K/uL  ? Eosinophils Relative 2 %  ? Eosinophils Absolute 0.2 0.0 - 0.5 K/uL  ? Basophils Relative 0 %  ? Basophils Absolute 0.0 0.0 - 0.1 K/uL  ? Immature Granulocytes 0 %  ? Abs Immature Granulocytes 0.03 0.00 - 0.07 K/uL  ?Comprehensive metabolic panel  ?Result Value Ref Range  ? Sodium 137 135 - 145 mmol/L  ? Potassium 3.4 (L) 3.5 - 5.1 mmol/L  ? Chloride 103 98 - 111 mmol/L  ? CO2 27 22 - 32 mmol/L  ? Glucose, Bld 110 (H) 70 - 99 mg/dL  ? BUN 14 6 - 20 mg/dL  ? Creatinine, Ser 0.67 0.61 - 1.24 mg/dL  ? Calcium 9.6 8.9 - 10.3 mg/dL  ? Total Protein 7.0 6.5 - 8.1 g/dL  ? Albumin 4.3 3.5 - 5.0 g/dL  ? AST 524 (H) 15 - 41 U/L  ? ALT 172 (H) 0 - 44 U/L  ? Alkaline Phosphatase 67 38 - 126 U/L  ? Total Bilirubin 0.4 0.3 - 1.2 mg/dL  ? GFR, Estimated >60 >60 mL/min  ? Anion gap 7 5 - 15  ?Urinalysis, Routine w reflex microscopic Urine, Clean Catch  ?Result Value Ref Range  ? Color, Urine COLORLESS (A) YELLOW  ? APPearance CLEAR CLEAR  ? Specific Gravity, Urine 1.009 1.005 - 1.030  ? pH 5.5 5.0 - 8.0  ? Glucose, UA NEGATIVE NEGATIVE mg/dL  ? Hgb urine dipstick TRACE (A) NEGATIVE  ? Bilirubin Urine NEGATIVE NEGATIVE  ? Ketones, ur NEGATIVE NEGATIVE mg/dL  ? Protein, ur NEGATIVE NEGATIVE mg/dL  ? Nitrite NEGATIVE NEGATIVE  ? Leukocytes,Ua NEGATIVE NEGATIVE  ? RBC / HPF 0-5 0 - 5 RBC/hpf  ? WBC, UA 0-5 0 - 5 WBC/hpf  ? Mucus PRESENT   ?CK  ?Result Value Ref Range  ? Total CK 17,490 (H) 49 - 397 U/L  ? ? ? ?EKG ?None ? ?Radiology ?No  results found. ? ?Procedures ?Procedures   ? ? ?Medications Ordered in ED ?Medications  ?sodium chloride 0.9 % bolus 1,000 mL (has no administration in time range)  ? ? ?ED Course/ Medical Decision Making/ A&P ?  ?                        ?Medical Decision Making ?Problems Addressed: ?Elevated CK: acute illness or injury ?Elevated liver function tests: acute illness or injury with systemic symptoms that poses a threat to life or bodily functions ?Exertional rhabdomyolysis: acute illness or injury with systemic symptoms that poses a threat to life or bodily functions ? ?Amount and/or Complexity of Data Reviewed ?External Data Reviewed: notes. ?Labs: ordered. Decision-making details documented in ED Course. ?Discussion of management or test interpretation with external provider(s): Hospitalists - discussed pt.  ? ?Risk ?Prescription drug management. ?Decision regarding hospitalization. ? ?Iv ns. Continuous pulse ox and cardiac monitoring. Labs ordered/sent. Imaging ordered.  ? ?Reviewed nursing notes and prior charts for additional history. External reports reviewed.  ? ?Cardiac monitor: sinus rhythm, rate 90. ? ?Labs reviewed/interpreted by me - ck 17,000 ?Lfts high. U dip with blood but no rbc.  ? ?Iv ns bolus.  ? ?Additional ivf. Bicarb gtt. ? ?Signed out to Dr Blinda LeatherwoodPollina to admit. Continue ivf.  ? ? ?CRITICAL CARE RE: rhabdomyolysis, high ck, elevated lfts.  ?Performed by: Suzi RootsKevin E Alexis Reber ?Total critical care time:40 minutes ?Critical care time was exclusive of separately billable procedures and treating other patients. ?Critical care was necessary to treat or prevent imminent or life-threatening deterioration. ?Critical care was time spent personally by me on the following activities: development of treatment plan with patient and/or surrogate as well as nursing, discussions with consultants, evaluation of patient's response to treatment, examination of patient, obtaining history from patient or surrogate, ordering and performing treatments and interventions,  ordering and review of laboratory studies, ordering and review of radiographic studies, pulse oximetry and re-evaluation of patient's condition. ? ? ? ? ? ? ? ? ?Final Clinical Impression(s) / ED Diagnoses ?Final diagnoses:

## 2021-10-08 NOTE — Progress Notes (Signed)
Gary Robbins is a 36 y.o. male here for a follow up of elevated LFTs. ? ?History of Present Illness:  ? ?Chief Complaint  ?Patient presents with  ? Follow-up  ? ? ?HPI ? ?Elevated LFTs ?Following routine lab works completed on 10/07/21, pt was found to have elevated LFTs. Pt states that he hadn't been drinking for over a year, but did recently have one beer over the weekend. Gary Robbins reports he recently participated in an intense workout on Friday and Saturday that left him sore for a couple of days which caused him to have to take tylenol as needed. Upon further discussion, pt explained that he initially started with legs and then moved onto upper body the next day. He admittedly felt he pushed past his limits which resulted in him being so sore that he couldn't sit down normally. In addition to this, he also went bowling later that evening for his birthday and found this made matters worse in terms of soreness on the following day.  ? ?As of now, he reports that his soreness has since greatly improved. After discussing his lab results with his PCP, Gary Robbins, Gary Robbins has been making a conscious effort to be drinking enough water daily. At this time he is interested in re-checking these levels today.  ? ?Denies changes in urine, abdominal pain, excessive alcohol intake, fhx of liver disease, vomiting, nausea, changes to appetite/BMs, unintentional weight loss, or recent illnesses. ? ?Past Medical History:  ?Diagnosis Date  ? Allergy   ? Anxiety   ? Depression   ? ?  ?Social History  ? ?Tobacco Use  ? Smoking status: Never  ? Smokeless tobacco: Never  ?Vaping Use  ? Vaping Use: Never used  ?Substance Use Topics  ? Alcohol use: Not Currently  ?  Comment: OCCASIONAL once a month  ? Drug use: No  ? ? ?Past Surgical History:  ?Procedure Laterality Date  ? GUM SURGERY    ? UPPER GI ENDOSCOPY    ? ? ?Family History  ?Problem Relation Age of Onset  ? Asthma Mother   ? Depression Mother   ? Hyperlipidemia Mother    ? Bipolar disorder Mother   ? Hearing loss Father   ? Depression Maternal Grandmother   ? Dementia Maternal Grandmother   ? Arthritis Maternal Grandmother   ? Glaucoma Maternal Grandmother   ? Prostate cancer Maternal Grandfather   ? Bone cancer Maternal Grandfather   ? Heart disease Paternal Grandfather   ? Hyperlipidemia Paternal Grandfather   ? Breast cancer Other   ?     maternal great aunts  ? Diabetes Maternal Great-grandmother   ? Heart disease Maternal Great-grandmother   ? Colon cancer Neg Hx   ? Esophageal cancer Neg Hx   ? Inflammatory bowel disease Neg Hx   ? Liver disease Neg Hx   ? Pancreatic cancer Neg Hx   ? Rectal cancer Neg Hx   ? Stomach cancer Neg Hx   ? ? ?No Known Allergies ? ?Current Medications:  ? ?Current Outpatient Medications:  ?  hydrOXYzine (ATARAX) 10 MG tablet, Take 1 tablet (10 mg total) by mouth 4 (four) times daily as needed. Take 10 mg by mouth 4 (four) times daily as needed., Disp: 30 tablet, Rfl: 1  ? ?Review of Systems:  ? ?ROS ?Negative unless otherwise specified per HPI. ?Vitals:  ? ?Vitals:  ? 10/08/21 1403  ?BP: 104/66  ?Pulse: 92  ?Temp: 98.8 ?F (37.1 ?C)  ?SpO2: 96%  ?Weight:  165 lb 4 oz (75 kg)  ?Height: 5\' 7"  (1.702 m)  ?   ?Body mass index is 25.88 kg/m?. ? ?Physical Exam:  ? ?Physical Exam ?Vitals and nursing note reviewed.  ?Constitutional:   ?   General: He is not in acute distress. ?   Appearance: Normal appearance. He is well-developed. He is not ill-appearing or toxic-appearing.  ?HENT:  ?   Head: Normocephalic and atraumatic.  ?   Right Ear: Tympanic membrane, ear canal and external ear normal. Tympanic membrane is not erythematous, retracted or bulging.  ?   Left Ear: Tympanic membrane, ear canal and external ear normal. Tympanic membrane is not erythematous, retracted or bulging.  ?   Nose: Nose normal.  ?   Mouth/Throat:  ?   Pharynx: Uvula midline. No posterior oropharyngeal erythema.  ?Eyes:  ?   Pupils: Pupils are equal, round, and reactive to light.   ?Cardiovascular:  ?   Rate and Rhythm: Normal rate and regular rhythm.  ?   Pulses: Normal pulses.  ?   Heart sounds: Normal heart sounds, S1 normal and S2 normal.  ?Pulmonary:  ?   Effort: Pulmonary effort is normal.  ?   Breath sounds: Normal breath sounds.  ?Abdominal:  ?   General: Bowel sounds are normal. There is no distension.  ?   Palpations: Abdomen is soft. There is no mass.  ?   Tenderness: There is no abdominal tenderness. There is no guarding or rebound.  ?Musculoskeletal:     ?   General: Normal range of motion.  ?   Cervical back: Normal range of motion and neck supple.  ?Lymphadenopathy:  ?   Cervical: No cervical adenopathy.  ?Skin: ?   General: Skin is warm and dry.  ?Neurological:  ?   Mental Status: He is alert and oriented to person, place, and time.  ?   GCS: GCS eye subscore is 4. GCS verbal subscore is 5. GCS motor subscore is 6.  ?   Cranial Nerves: No cranial nerve deficit.  ?   Coordination: Coordination normal.  ?   Deep Tendon Reflexes: Reflexes are normal and symmetric. Reflexes normal.  ?Psychiatric:     ?   Speech: Speech normal.     ?   Behavior: Behavior normal. Behavior is cooperative.     ?   Thought Content: Thought content normal.     ?   Judgment: Judgment normal.  ? ? ?Assessment and Plan:  ? ?Elevated LFTs ?Update labs, will make recommendations accordingly  ?Possible rhabdo ?Informed patient to avoid any excessive activity and to push more fluids  ?Advised patient that if any new/unusual symptoms occur to follow up with PCP ? ?I,Havlyn C Ratchford,acting as a scribe for , PA.,have documented all relevant documentation on the behalf of [employer], PA,as directed by  Jarold Motto, PA while in the presence of Jarold Motto, Jarold Motto. ? ?[place], PA, have reviewed all documentation for this visit. The documentation on 10/08/21 for the exam, diagnosis, procedures, and orders are all accurate and complete. ? ? ?10/10/21, PA-C ? ?

## 2021-10-08 NOTE — ED Triage Notes (Signed)
Patient here POV from Home with Abnormal Lab. ? ?Patient states he had a Strenuous Exercise on Friday for the First Time. Patient then had Normal PCP Check-Up on Tuesday and had Routine Blood Panel completed.  ? ?Patient was informed of Abnormal CK and Abnormal Liver Enzymes and instructed to [facility] and come for ED Evaluation.  ? ?Patient complains of No Pain or SOB. ? ?A&Ox4. GCS 15. Ambulatory. NAD Noted during Triage. ?

## 2021-10-09 ENCOUNTER — Encounter (HOSPITAL_COMMUNITY): Payer: Self-pay | Admitting: Internal Medicine

## 2021-10-09 DIAGNOSIS — Z8042 Family history of malignant neoplasm of prostate: Secondary | ICD-10-CM | POA: Diagnosis not present

## 2021-10-09 DIAGNOSIS — Z20822 Contact with and (suspected) exposure to covid-19: Secondary | ICD-10-CM | POA: Diagnosis present

## 2021-10-09 DIAGNOSIS — Z8249 Family history of ischemic heart disease and other diseases of the circulatory system: Secondary | ICD-10-CM | POA: Diagnosis not present

## 2021-10-09 DIAGNOSIS — M6282 Rhabdomyolysis: Secondary | ICD-10-CM | POA: Diagnosis present

## 2021-10-09 DIAGNOSIS — Z83438 Family history of other disorder of lipoprotein metabolism and other lipidemia: Secondary | ICD-10-CM | POA: Diagnosis not present

## 2021-10-09 DIAGNOSIS — Z833 Family history of diabetes mellitus: Secondary | ICD-10-CM | POA: Diagnosis not present

## 2021-10-09 DIAGNOSIS — R7989 Other specified abnormal findings of blood chemistry: Secondary | ICD-10-CM | POA: Diagnosis not present

## 2021-10-09 DIAGNOSIS — E876 Hypokalemia: Secondary | ICD-10-CM | POA: Diagnosis present

## 2021-10-09 DIAGNOSIS — F419 Anxiety disorder, unspecified: Secondary | ICD-10-CM | POA: Diagnosis present

## 2021-10-09 LAB — CBC WITH DIFFERENTIAL/PLATELET
Abs Immature Granulocytes: 0.02 10*3/uL (ref 0.00–0.07)
Basophils Absolute: 0 10*3/uL (ref 0.0–0.1)
Basophils Relative: 0 %
Eosinophils Absolute: 0 10*3/uL (ref 0.0–0.5)
Eosinophils Relative: 1 %
HCT: 43.9 % (ref 39.0–52.0)
Hemoglobin: 14.8 g/dL (ref 13.0–17.0)
Immature Granulocytes: 0 %
Lymphocytes Relative: 25 %
Lymphs Abs: 1.6 10*3/uL (ref 0.7–4.0)
MCH: 30.3 pg (ref 26.0–34.0)
MCHC: 33.7 g/dL (ref 30.0–36.0)
MCV: 90 fL (ref 80.0–100.0)
Monocytes Absolute: 0.4 10*3/uL (ref 0.1–1.0)
Monocytes Relative: 6 %
Neutro Abs: 4.4 10*3/uL (ref 1.7–7.7)
Neutrophils Relative %: 68 %
Platelets: 266 10*3/uL (ref 150–400)
RBC: 4.88 MIL/uL (ref 4.22–5.81)
RDW: 12.3 % (ref 11.5–15.5)
WBC: 6.4 10*3/uL (ref 4.0–10.5)
nRBC: 0 % (ref 0.0–0.2)

## 2021-10-09 LAB — HEPATITIS PANEL, ACUTE
HCV Ab: NONREACTIVE
Hep A IgM: NONREACTIVE
Hep A IgM: NONREACTIVE
Hep B C IgM: NONREACTIVE
Hep B C IgM: NONREACTIVE
Hepatitis B Surface Ag: NONREACTIVE
Hepatitis B Surface Ag: NONREACTIVE
Hepatitis C Ab: NONREACTIVE
SIGNAL TO CUT-OFF: 0.03 (ref <1.00)

## 2021-10-09 LAB — ACETAMINOPHEN LEVEL: Acetaminophen (Tylenol), Serum: <10 ug/mL — ABNORMAL LOW (ref 10–30)

## 2021-10-09 LAB — HIV ANTIBODY (ROUTINE TESTING W REFLEX): HIV Screen 4th Generation wRfx: NONREACTIVE

## 2021-10-09 LAB — RESP PANEL BY RT-PCR (FLU A&B, COVID) ARPGX2
Influenza A by PCR: NEGATIVE
Influenza B by PCR: NEGATIVE
SARS Coronavirus 2 by RT PCR: NEGATIVE

## 2021-10-09 LAB — BASIC METABOLIC PANEL
Anion gap: 5 (ref 5–15)
BUN: 14 mg/dL (ref 6–20)
CO2: 28 mmol/L (ref 22–32)
Calcium: 8.7 mg/dL — ABNORMAL LOW (ref 8.9–10.3)
Chloride: 105 mmol/L (ref 98–111)
Creatinine, Ser: 0.61 mg/dL (ref 0.61–1.24)
GFR, Estimated: >60 mL/min (ref >60)
Glucose, Bld: 113 mg/dL — ABNORMAL HIGH (ref 70–99)
Potassium: 4.2 mmol/L (ref 3.5–5.1)
Sodium: 138 mmol/L (ref 135–145)

## 2021-10-09 LAB — MAGNESIUM: Magnesium: 2 mg/dL (ref 1.7–2.4)

## 2021-10-09 LAB — HEPATIC FUNCTION PANEL
ALT: 162 U/L — ABNORMAL HIGH (ref 0–44)
AST: 470 U/L — ABNORMAL HIGH (ref 15–41)
Albumin: 4 g/dL (ref 3.5–5.0)
Alkaline Phosphatase: 69 U/L (ref 38–126)
Bilirubin, Direct: 0.1 mg/dL (ref 0.0–0.2)
Indirect Bilirubin: 0.5 mg/dL (ref 0.3–0.9)
Total Bilirubin: 0.6 mg/dL (ref 0.3–1.2)
Total Protein: 6.9 g/dL (ref 6.5–8.1)

## 2021-10-09 LAB — PROTIME-INR
INR: 1 (ref 0.8–1.2)
Prothrombin Time: 13.5 seconds (ref 11.4–15.2)

## 2021-10-09 LAB — PHOSPHORUS: Phosphorus: 3.6 mg/dL (ref 2.5–4.6)

## 2021-10-09 LAB — CK: Total CK: 14644 U/L — ABNORMAL HIGH (ref 49–397)

## 2021-10-09 MED ORDER — SODIUM BICARBONATE 8.4 % IV SOLN
INTRAVENOUS | Status: AC
  Filled 2021-10-09: qty 100

## 2021-10-09 MED ORDER — SODIUM BICARBONATE 8.4 % IV SOLN
INTRAVENOUS | Status: DC
  Filled 2021-10-09: qty 150

## 2021-10-09 MED ORDER — ONDANSETRON HCL 4 MG/2ML IJ SOLN
4.0000 mg | Freq: Once | INTRAMUSCULAR | Status: AC
  Administered 2021-10-09: 4 mg via INTRAVENOUS
  Filled 2021-10-09: qty 2

## 2021-10-09 MED ORDER — ENOXAPARIN SODIUM 40 MG/0.4ML IJ SOSY
40.0000 mg | PREFILLED_SYRINGE | INTRAMUSCULAR | Status: DC
  Administered 2021-10-09 – 2021-10-10 (×2): 40 mg via SUBCUTANEOUS
  Filled 2021-10-09 (×2): qty 0.4

## 2021-10-09 MED ORDER — SODIUM BICARBONATE 8.4 % IV SOLN
INTRAVENOUS | Status: AC
  Filled 2021-10-09: qty 150

## 2021-10-09 MED ORDER — SODIUM BICARBONATE 8.4 % IV SOLN
INTRAVENOUS | Status: AC
  Filled 2021-10-09: qty 50

## 2021-10-09 MED ORDER — HEPARIN SODIUM (PORCINE) 5000 UNIT/ML IJ SOLN
5000.0000 [IU] | Freq: Three times a day (TID) | INTRAMUSCULAR | Status: DC
  Administered 2021-10-09: 5000 [IU] via SUBCUTANEOUS
  Filled 2021-10-09: qty 1

## 2021-10-09 MED ORDER — SODIUM CHLORIDE 0.9 % IV SOLN: INTRAVENOUS | Status: DC

## 2021-10-09 MED ORDER — HYDROXYZINE HCL 10 MG PO TABS
10.0000 mg | ORAL_TABLET | Freq: Three times a day (TID) | ORAL | Status: DC | PRN
  Administered 2021-10-09 – 2021-10-11 (×4): 10 mg via ORAL
  Filled 2021-10-09 (×7): qty 1

## 2021-10-09 NOTE — Progress Notes (Signed)
Plan of Care Note for accepted transfer ? ? ?Patient: Gary Robbins MRN: [id number]   DOA: 10/08/2021 ? ?Facility requesting transfer: [facility] ?Requesting Provider: Dr. Blinda Leatherwood ?Reason for transfer: Rhabdomyolysis ?Facility course:  ?Patient is a 36 year old male with a past medical history of anxiety, depression recently did strenuous exercise and seen by PCP for muscle cramping.  Found to have elevated transaminases and CK concerning for rhabdomyolysis, sent to the ED for further evaluation.  In the ED, vital signs stable.  CK 17,490, potassium 3.4, bicarb 27, creatinine 0.6.  AST 524, ALT 172.  Patient was given potassium and bicarb supplementation, IV fluid hydration. ? ?Plan of care: ?The patient is accepted for admission to Med-surg  unit, at [facility]. ? ?Author: ?John Giovanni, MD ?10/09/2021 ? ?Check [email or web address] for on-call coverage. ? ?Nursing staff, Please call [facility] Admits & Consults System-Wide number on Amion as soon as patient's arrival, so appropriate admitting provider can evaluate the pt. ?

## 2021-10-09 NOTE — Hospital Course (Addendum)
Gary Robbins is a 36 y.o. male with a history of anxiety. Patient presented secondary to abnormal labs showing elevated AST/ALT with associated elevated CK, consistent with rhabdomyolysis. IV fluids initiated with improvement of CK. ?

## 2021-10-09 NOTE — Progress Notes (Signed)
Transition of Care (TOC) Screening Note ? ?Patient Details  ?Name: Gary Robbins ?Date of Birth: March 07, 1986 ? ?Transition of Care (TOC) CM/SW Contact:    ?Ewing Schlein, LCSW ?Phone Number: ?10/09/2021, 10:27 AM ? ?Transition of Care Department [facility]) has reviewed patient and no TOC needs have been identified at this time. We will continue to monitor patient advancement through interdisciplinary progression rounds. If new patient transition needs arise, please place a TOC consult. ?

## 2021-10-09 NOTE — Progress Notes (Signed)
? ?PROGRESS NOTE ? ? ? ?Gary Robbins  GYJ:[id number] DOB: [birth year] DOA: 10/08/2021 ?PCP: Allwardt, Crist Infante, PA-C ? ? ?Brief Narrative: ?Gary Robbins is a 36 y.o. male with no significant medical history. Patient presented secondary to abnormal labs showing elevated AST/ALT with associated elevated CK, consistent with rhabdomyolysis. IV fluids initiated. ? ? ?Assessment and Plan: ?* Non-traumatic rhabdomyolysis ?Secondary to strenuous exercise. CK of 17,490 on admission. Started on IV fluids. CK down to 14,644 today. ?-Daily CK, CMP ?-Switch to NS IV fluids @ 150 mL/hr ? ?Anxiety ?-Continue hydroxyzine ? ?Elevated LFTs ?Secondary to rhabdomyolysis. Improving with treatment of Rhabdomyolysis. ? ? ? ?DVT prophylaxis: Heparin > Lovenox ?Code Status:   Code Status: Full Code ?Family Communication: Wife at bedside ?Disposition Plan: Discharge home likely in 2-3 days pending improved CK ? ? ?Consultants:  ?None ? ?Procedures:  ?None ? ?Antimicrobials: ?None  ? ? ?Subjective: ?Patient reports no issues overnight. Muscle aches are improved. ? ?Objective: ?BP 112/61 (BP Location: Right Arm)   Pulse 67   Temp 98.6 ?F (37 ?C) (Oral)   Resp 16   Ht 5\' 7"  (1.702 m)   Wt 75 kg   SpO2 99%   BMI 25.90 kg/m?  ? ?Examination: ? ?General exam: Appears calm and comfortable ?Respiratory system: Clear to auscultation. Respiratory effort normal. ?Cardiovascular system: S1 & S2 heard, RRR. No murmurs. ?Gastrointestinal system: Abdomen is nondistended, soft and non-tender. Normal bowel sounds heard. ?Central nervous system: Alert and oriented. No focal neurological deficits. ?Musculoskeletal: No edema. No calf tenderness ?Skin: No cyanosis. No rashes ?Psychiatry: Judgement and insight appear normal. Mood & affect appropriate.  ? ? ?Data Reviewed: I have personally reviewed following labs and imaging studies ? ?CBC ?Lab Results  ?Component Value Date  ? WBC 6.4 10/09/2021  ? RBC 4.88 10/09/2021  ? HGB 14.8 10/09/2021  ?  HCT 43.9 10/09/2021  ? MCV 90.0 10/09/2021  ? MCH 30.3 10/09/2021  ? PLT 266 10/09/2021  ? MCHC 33.7 10/09/2021  ? RDW 12.3 10/09/2021  ? LYMPHSABS 1.6 10/09/2021  ? MONOABS 0.4 10/09/2021  ? EOSABS 0.0 10/09/2021  ? BASOSABS 0.0 10/09/2021  ? ? ? ?Last metabolic panel ?Lab Results  ?Component Value Date  ? NA 138 10/09/2021  ? K 4.2 10/09/2021  ? CL 105 10/09/2021  ? CO2 28 10/09/2021  ? BUN 14 10/09/2021  ? CREATININE 0.61 10/09/2021  ? GLUCOSE 113 (H) 10/09/2021  ? GFRNONAA >60 10/09/2021  ? GFRAA >60 07/14/2016  ? CALCIUM 8.7 (L) 10/09/2021  ? PHOS 3.6 10/09/2021  ? PROT 6.9 10/09/2021  ? ALBUMIN 4.0 10/09/2021  ? BILITOT 0.6 10/09/2021  ? ALKPHOS 69 10/09/2021  ? AST 470 (H) 10/09/2021  ? ALT 162 (H) 10/09/2021  ? ANIONGAP 5 10/09/2021  ? ? ?GFR: ?Estimated Creatinine Clearance: 119.3 mL/min (by C-G formula based on SCr of 0.61 mg/dL). ? ?Recent Results (from the past 240 hour(s))  ?Resp Panel by RT-PCR (Flu A&B, Covid) Nasopharyngeal Swab     Status: None  ? Collection Time: 10/09/21  4:59 AM  ? Specimen: Nasopharyngeal Swab; Nasopharyngeal(NP) swabs in vial transport medium  ?Result Value Ref Range Status  ? SARS Coronavirus 2 by RT PCR NEGATIVE NEGATIVE Final  ?  Comment: (NOTE) ?SARS-CoV-2 target nucleic acids are NOT DETECTED. ? ?The SARS-CoV-2 RNA is generally detectable in upper respiratory ?specimens during the acute phase of infection. The lowest ?concentration of SARS-CoV-2 viral copies this assay can detect is ?138 copies/mL. A negative result does not preclude  SARS-Cov-2 ?infection and should not be used as the sole basis for treatment or ?other patient management decisions. A negative result may occur with  ?improper specimen collection/handling, submission of specimen other ?than nasopharyngeal swab, presence of viral mutation(s) within the ?areas targeted by this assay, and inadequate number of viral ?copies(<138 copies/mL). A negative result must be combined with ?clinical observations, patient  history, and epidemiological ?information. The expected result is Negative. ? ?Fact Sheet for Patients:  ?BloggerCourse.com ? ?Fact Sheet for Healthcare Providers:  ?SeriousBroker.it ? ?This test is no t yet approved or cleared by the [place] FDA and  ?has been authorized for detection and/or diagnosis of SARS-CoV-2 by ?FDA under an Emergency Use Authorization (EUA). This EUA will remain  ?in effect (meaning this test can be used) for the duration of the ?COVID-19 declaration under Section 564(b)(1) of the Act, 21 ?U.S.C.section 360bbb-3(b)(1), unless the authorization is terminated  ?or revoked sooner.  ? ? ?  ? Influenza A by PCR NEGATIVE NEGATIVE Final  ? Influenza B by PCR NEGATIVE NEGATIVE Final  ?  Comment: (NOTE) ?The Xpert Xpress SARS-CoV-2/FLU/RSV plus assay is intended as an aid ?in the diagnosis of influenza from Nasopharyngeal swab specimens and ?should not be used as a sole basis for treatment. Nasal washings and ?aspirates are unacceptable for Xpert Xpress SARS-CoV-2/FLU/RSV ?testing. ? ?Fact Sheet for Patients: ?BloggerCourse.com ? ?Fact Sheet for Healthcare Providers: ?SeriousBroker.it ? ?This test is not yet approved or cleared by the [place] FDA and ?has been authorized for detection and/or diagnosis of SARS-CoV-2 by ?FDA under an Emergency Use Authorization (EUA). This EUA will remain ?in effect (meaning this test can be used) for the duration of the ?COVID-19 declaration under Section 564(b)(1) of the Act, 21 U.S.C. ?section 360bbb-3(b)(1), unless the authorization is terminated or ?revoked. ? ?Performed at [facility], [address] ?[place], [place] [address] ?  ?  ? ? ?Radiology Studies: ?No results found. ? ? ? LOS: 0 days  ? ? ?Jacquelin Hawking, MD ?[facility] ?10/09/2021, 12:30 PM ? ? ?If 7PM-7AM, please contact night-coverage ?[email or web address] ? ?

## 2021-10-09 NOTE — Assessment & Plan Note (Addendum)
Secondary to rhabdomyolysis. Improving with treatment of Rhabdomyolysis. ?

## 2021-10-09 NOTE — Assessment & Plan Note (Addendum)
Secondary to strenuous exercise. CK of 17,490 on admission. Started on high-rate IV fluids. CK down to 2,392?on day of discharge. Patient recommended to continue good hydration. ?

## 2021-10-09 NOTE — Assessment & Plan Note (Addendum)
Continue hydroxyzine.

## 2021-10-09 NOTE — H&P (Addendum)
?History and Physical  ? ? ?Gary Robbins GYI:[id number] DOB: [birth year] DOA: 10/08/2021 ? ?PCP: Allwardt, Crist Infante, PA-C  ?Patient coming from: Home. ? ?Chief Complaint: Abnormal labs. ? ?HPI: Gary Robbins is a 36 y.o. male with no significant past medical history he had his routine labs done yesterday and found to be having elevated LFTs.  Last week patient had been to the gym and had workouts done following which patient had intense muscle pain.  Did not notice any discoloration of urine.  Yesterday when he had his routine labs done which found elevated LFTs.  And CK levels were added which found to be elevated and was advised to come to the ER. ? ?ED Course: In the ER patient labs show CK to be around 17,000 and LFTs were showing AST of 4024 ALT of 172.  Bilirubin is normal alkaline phosphatase was normal.  Abdomen appears benign.  Potassium was 3.4.  Patient was started on bicarb drip admitted for further observation.  COVID test is pending. ? ?Review of Systems: As per HPI, rest all negative. ? ? ?Past Medical History:  ?Diagnosis Date  ? Allergy   ? Anxiety   ? Depression   ? ? ?Past Surgical History:  ?Procedure Laterality Date  ? GUM SURGERY    ? UPPER GI ENDOSCOPY    ? ? ? reports that he has never smoked. He has never used smokeless tobacco. He reports that he does not currently use alcohol. He reports that he does not use drugs. ? ?No Known Allergies ? ?Family History  ?Problem Relation Age of Onset  ? Asthma Mother   ? Depression Mother   ? Hyperlipidemia Mother   ? Bipolar disorder Mother   ? Hearing loss Father   ? Depression Maternal Grandmother   ? Dementia Maternal Grandmother   ? Arthritis Maternal Grandmother   ? Glaucoma Maternal Grandmother   ? Prostate cancer Maternal Grandfather   ? Bone cancer Maternal Grandfather   ? Heart disease Paternal Grandfather   ? Hyperlipidemia Paternal Grandfather   ? Breast cancer Other   ?     maternal great aunts  ? Diabetes Maternal Great-grandmother    ? Heart disease Maternal Great-grandmother   ? Colon cancer Neg Hx   ? Esophageal cancer Neg Hx   ? Inflammatory bowel disease Neg Hx   ? Liver disease Neg Hx   ? Pancreatic cancer Neg Hx   ? Rectal cancer Neg Hx   ? Stomach cancer Neg Hx   ? ? ?Prior to Admission medications   ?Medication Sig Start Date End Date Taking? Authorizing Provider  ?hydrOXYzine (ATARAX) 10 MG tablet Take 1 tablet (10 mg total) by mouth 4 (four) times daily as needed. Take 10 mg by mouth 4 (four) times daily as needed. 09/09/21   Allwardt, Crist Infante, PA-C  ? ? ?Physical Exam: ?Constitutional: Moderately built and nourished. ?Vitals:  ? 10/08/21 2130 10/08/21 2236 10/08/21 2345 10/09/21 0214  ?BP: 112/77 102/67 109/69 104/66  ?Pulse: 77 65 72 72  ?Resp: 18 18 16 18   ?Temp:    98.9 ?F (37.2 ?C)  ?TempSrc:    Oral  ?SpO2: 97% 96% 100% 99%  ?Weight:      ?Height:      ? ?Eyes: Anicteric no pallor. ?ENMT: No discharge from the ears eyes nose and mouth. ?Neck: No mass felt.  No neck rigidity. ?Respiratory: No rhonchi or crepitations. ?Cardiovascular: S1-S2 heard. ?Abdomen: Soft nontender bowel sound present. ?Musculoskeletal: No edema. ?Skin:  No rash. ?Neurologic: Alert awake oriented time place and person.  Moves all extremities. ?Psychiatric: Appears normal.  Normal affect. ? ? ?Labs on Admission: I have personally reviewed following labs and imaging studies ? ?CBC: ?Recent Labs  ?Lab 10/07/21 ?5027 10/08/21 ?2215  ?WBC 5.5 8.8  ?NEUTROABS 3.5 5.9  ?HGB 15.2 14.6  ?HCT 44.4 42.3  ?MCV 89.4 86.7  ?PLT 250.0 259  ? ?Basic Metabolic Panel: ?Recent Labs  ?Lab 10/07/21 ?7412 10/08/21 ?2215  ?NA 138 137  ?K 4.2 3.4*  ?CL 102 103  ?CO2 31 27  ?GLUCOSE 92 110*  ?BUN 12 14  ?CREATININE 0.72 0.67  ?CALCIUM 9.5 9.6  ? ?GFR: ?Estimated Creatinine Clearance: 119.3 mL/min (by C-G formula based on SCr of 0.67 mg/dL). ?Liver Function Tests: ?Recent Labs  ?Lab 10/07/21 ?8786 10/08/21 ?1425 10/08/21 ?2215  ?AST 697* 593* 524*  ?ALT 166* 182* 172*  ?ALKPHOS 78 82  67  ?BILITOT 0.6 0.6 0.4  ?PROT 7.0 6.8 7.0  ?ALBUMIN 4.5 4.6 4.3  ? ?No results for input(s): LIPASE, AMYLASE in the last 168 hours. ?No results for input(s): AMMONIA in the last 168 hours. ?Coagulation Profile: ?No results for input(s): INR, PROTIME in the last 168 hours. ?Cardiac Enzymes: ?Recent Labs  ?Lab 10/08/21 ?1425 10/08/21 ?2215  ?CKTOTAL >2,000* 17,490*  ? ?BNP (last 3 results) ?No results for input(s): PROBNP in the last 8760 hours. ?HbA1C: ?No results for input(s): HGBA1C in the last 72 hours. ?CBG: ?No results for input(s): GLUCAP in the last 168 hours. ?Lipid Profile: ?Recent Labs  ?  10/07/21 ?0818  ?CHOL 203*  ?HDL 44.40  ?LDLCALC 121*  ?TRIG 187.0*  ?CHOLHDL 5  ? ?Thyroid Function Tests: ?No results for input(s): TSH, T4TOTAL, FREET4, T3FREE, THYROIDAB in the last 72 hours. ?Anemia Panel: ?No results for input(s): VITAMINB12, FOLATE, FERRITIN, TIBC, IRON, RETICCTPCT in the last 72 hours. ?Urine analysis: ?   ?Component Value Date/Time  ? COLORURINE COLORLESS (A) 10/08/2021 2215  ? APPEARANCEUR CLEAR 10/08/2021 2215  ? LABSPEC 1.009 10/08/2021 2215  ? PHURINE 5.5 10/08/2021 2215  ? GLUCOSEU NEGATIVE 10/08/2021 2215  ? GLUCOSEU NEGATIVE 10/08/2021 1426  ? HGBUR TRACE (A) 10/08/2021 2215  ? BILIRUBINUR NEGATIVE 10/08/2021 2215  ? KETONESUR NEGATIVE 10/08/2021 2215  ? PROTEINUR NEGATIVE 10/08/2021 2215  ? UROBILINOGEN 0.2 10/08/2021 1426  ? NITRITE NEGATIVE 10/08/2021 2215  ? LEUKOCYTESUR NEGATIVE 10/08/2021 2215  ? ?Sepsis Labs: ?@LABRCNTIP (procalcitonin:4,lacticidven:4) ?)No results found for this or any previous visit (from the past 240 hour(s)).  ? ?Radiological Exams on Admission: ?No results found. ? ? ? ?Assessment/Plan ?Principal Problem: ?  Rhabdomyolysis ?  ? ?Rhabdomyolysis -likely related to recent exertion from exercise.  We will continue with bicarb drip recheck CK levels follow metabolic panel check phosphorus levels.  Patient never had any previous episodes of rhabdomyolysis and no  family history. ?Elevated LFTs likely from rhabdomyolysis.  Follow LFTs.  Will check acute hepatitis panel. ?Mild hypokalemia -replace and recheck. ? ?COVID test is pending. ? ? ?Since patient has rhabdomyolysis and will need close monitoring with bicarb drip will need inpatient status. ? ? ?DVT prophylaxis: Heparin. ?Code Status: Full code. ?Family Communication: Discussed with patient. ?Disposition Plan: Home. ?Consults called: None. ?Admission status: Inpatient. ? ? ? MD ?[facility] ?Pager [phone]. ? ?If 7PM-7AM, please contact night-coverage ?[email or web address] ?Password [email or web address] ? ?10/09/2021, 3:25 AM  ? ? ? ?

## 2021-10-10 DIAGNOSIS — F419 Anxiety disorder, unspecified: Secondary | ICD-10-CM

## 2021-10-10 DIAGNOSIS — R7989 Other specified abnormal findings of blood chemistry: Secondary | ICD-10-CM

## 2021-10-10 LAB — COMPREHENSIVE METABOLIC PANEL
ALT: 108 U/L — ABNORMAL HIGH (ref 0–44)
AST: 217 U/L — ABNORMAL HIGH (ref 15–41)
Albumin: 3.2 g/dL — ABNORMAL LOW (ref 3.5–5.0)
Alkaline Phosphatase: 52 U/L (ref 38–126)
Anion gap: 6 (ref 5–15)
BUN: 12 mg/dL (ref 6–20)
CO2: 24 mmol/L (ref 22–32)
Calcium: 8.2 mg/dL — ABNORMAL LOW (ref 8.9–10.3)
Chloride: 110 mmol/L (ref 98–111)
Creatinine, Ser: 0.4 mg/dL — ABNORMAL LOW (ref 0.61–1.24)
GFR, Estimated: >60 mL/min (ref >60)
Glucose, Bld: 94 mg/dL (ref 70–99)
Potassium: 3.7 mmol/L (ref 3.5–5.1)
Sodium: 140 mmol/L (ref 135–145)
Total Bilirubin: 0.4 mg/dL (ref 0.3–1.2)
Total Protein: 5.6 g/dL — ABNORMAL LOW (ref 6.5–8.1)

## 2021-10-10 LAB — CK: Total CK: 4979 U/L — ABNORMAL HIGH (ref 49–397)

## 2021-10-10 NOTE — Progress Notes (Signed)
? ?PROGRESS NOTE ? ? ? ?Gary Robbins  RDE:[id number] DOB: 04/05/1986 DOA: 10/08/2021 ?PCP: Allwardt, Crist Infante, PA-C ? ? ?Brief Narrative: ?Gary Robbins is a 36 y.o. male with a history of anxiety. Patient presented secondary to abnormal labs showing elevated AST/ALT with associated elevated CK, consistent with rhabdomyolysis. IV fluids initiated. ? ? ?Assessment and Plan: ?* Non-traumatic rhabdomyolysis ?Secondary to strenuous exercise. CK of 17,490 on admission. Started on IV fluids. CK down to 4,979 today. ?-Daily CK, CMP ?-Continue NS IV fluids @ 150 mL/hr ? ?Anxiety ?-Continue hydroxyzine ? ?Elevated LFTs ?Secondary to rhabdomyolysis. Continues to improve with treatment of Rhabdomyolysis. ? ? ? ?DVT prophylaxis: Lovenox ?Code Status:   Code Status: Full Code ?Family Communication: None at bedside ?Disposition Plan: Discharge home likely in 1 day pending improved CK ? ? ?Consultants:  ?None ? ?Procedures:  ?None ? ?Antimicrobials: ?None  ? ? ?Subjective: ?Patient reports some mild forearm pain. Otherwise, no issues. ? ?Objective: ?BP (!) 92/56 (BP Location: Right Arm)   Pulse (!) 51   Temp 97.9 ?F (36.6 ?C) (Oral)   Resp 17   Ht 5\' 7"  (1.702 m)   Wt 75 kg   SpO2 100%   BMI 25.90 kg/m?  ? ?Examination: ? ?General: Well appearing, no distress ? ? ?Data Reviewed: I have personally reviewed following labs and imaging studies ? ?CBC ?Lab Results  ?Component Value Date  ? WBC 6.4 10/09/2021  ? RBC 4.88 10/09/2021  ? HGB 14.8 10/09/2021  ? HCT 43.9 10/09/2021  ? MCV 90.0 10/09/2021  ? MCH 30.3 10/09/2021  ? PLT 266 10/09/2021  ? MCHC 33.7 10/09/2021  ? RDW 12.3 10/09/2021  ? LYMPHSABS 1.6 10/09/2021  ? MONOABS 0.4 10/09/2021  ? EOSABS 0.0 10/09/2021  ? BASOSABS 0.0 10/09/2021  ? ? ? ?Last metabolic panel ?Lab Results  ?Component Value Date  ? NA 140 10/10/2021  ? K 3.7 10/10/2021  ? CL 110 10/10/2021  ? CO2 24 10/10/2021  ? BUN 12 10/10/2021  ? CREATININE 0.40 (L) 10/10/2021  ? GLUCOSE 94 10/10/2021  ?  GFRNONAA >60 10/10/2021  ? GFRAA >60 07/14/2016  ? CALCIUM 8.2 (L) 10/10/2021  ? PHOS 3.6 10/09/2021  ? PROT 5.6 (L) 10/10/2021  ? ALBUMIN 3.2 (L) 10/10/2021  ? BILITOT 0.4 10/10/2021  ? ALKPHOS 52 10/10/2021  ? AST 217 (H) 10/10/2021  ? ALT 108 (H) 10/10/2021  ? ANIONGAP 6 10/10/2021  ? ? ?GFR: ?Estimated Creatinine Clearance: 119.3 mL/min (A) (by C-G formula based on SCr of 0.4 mg/dL (L)). ? ?Recent Results (from the past 240 hour(s))  ?Resp Panel by RT-PCR (Flu A&B, Covid) Nasopharyngeal Swab     Status: None  ? Collection Time: 10/09/21  4:59 AM  ? Specimen: Nasopharyngeal Swab; Nasopharyngeal(NP) swabs in vial transport medium  ?Result Value Ref Range Status  ? SARS Coronavirus 2 by RT PCR NEGATIVE NEGATIVE Final  ?  Comment: (NOTE) ?SARS-CoV-2 target nucleic acids are NOT DETECTED. ? ?The SARS-CoV-2 RNA is generally detectable in upper respiratory ?specimens during the acute phase of infection. The lowest ?concentration of SARS-CoV-2 viral copies this assay can detect is ?138 copies/mL. A negative result does not preclude SARS-Cov-2 ?infection and should not be used as the sole basis for treatment or ?other patient management decisions. A negative result may occur with  ?improper specimen collection/handling, submission of specimen other ?than nasopharyngeal swab, presence of viral mutation(s) within the ?areas targeted by this assay, and inadequate number of viral ?copies(<138 copies/mL). A negative result must  be combined with ?clinical observations, patient history, and epidemiological ?information. The expected result is Negative. ? ?Fact Sheet for Patients:  ?BloggerCourse.com ? ?Fact Sheet for Healthcare Providers:  ?SeriousBroker.it ? ?This test is no t yet approved or cleared by the [place] FDA and  ?has been authorized for detection and/or diagnosis of SARS-CoV-2 by ?FDA under an Emergency Use Authorization (EUA). This EUA will remain  ?in effect  (meaning this test can be used) for the duration of the ?COVID-19 declaration under Section 564(b)(1) of the Act, 21 ?U.S.C.section 360bbb-3(b)(1), unless the authorization is terminated  ?or revoked sooner.  ? ? ?  ? Influenza A by PCR NEGATIVE NEGATIVE Final  ? Influenza B by PCR NEGATIVE NEGATIVE Final  ?  Comment: (NOTE) ?The Xpert Xpress SARS-CoV-2/FLU/RSV plus assay is intended as an aid ?in the diagnosis of influenza from Nasopharyngeal swab specimens and ?should not be used as a sole basis for treatment. Nasal washings and ?aspirates are unacceptable for Xpert Xpress SARS-CoV-2/FLU/RSV ?testing. ? ?Fact Sheet for Patients: ?BloggerCourse.com ? ?Fact Sheet for Healthcare Providers: ?SeriousBroker.it ? ?This test is not yet approved or cleared by the [place] FDA and ?has been authorized for detection and/or diagnosis of SARS-CoV-2 by ?FDA under an Emergency Use Authorization (EUA). This EUA will remain ?in effect (meaning this test can be used) for the duration of the ?COVID-19 declaration under Section 564(b)(1) of the Act, 21 U.S.C. ?section 360bbb-3(b)(1), unless the authorization is terminated or ?revoked. ? ?Performed at [facility], [address] ?[place], [place] [address] ?  ?  ? ? ?Radiology Studies: ?No results found. ? ? ? LOS: 1 day  ? ? ?Jacquelin Hawking, MD ?[facility] ?10/10/2021, 8:14 AM ? ? ?If 7PM-7AM, please contact night-coverage ?[email or web address] ? ?

## 2021-10-11 LAB — COMPREHENSIVE METABOLIC PANEL
ALT: 88 U/L — ABNORMAL HIGH (ref 0–44)
AST: 127 U/L — ABNORMAL HIGH (ref 15–41)
Albumin: 3.2 g/dL — ABNORMAL LOW (ref 3.5–5.0)
Alkaline Phosphatase: 54 U/L (ref 38–126)
Anion gap: 6 (ref 5–15)
BUN: 13 mg/dL (ref 6–20)
CO2: 26 mmol/L (ref 22–32)
Calcium: 8.4 mg/dL — ABNORMAL LOW (ref 8.9–10.3)
Chloride: 108 mmol/L (ref 98–111)
Creatinine, Ser: 0.74 mg/dL (ref 0.61–1.24)
GFR, Estimated: >60 mL/min (ref >60)
Glucose, Bld: 87 mg/dL (ref 70–99)
Potassium: 3.9 mmol/L (ref 3.5–5.1)
Sodium: 140 mmol/L (ref 135–145)
Total Bilirubin: 0.5 mg/dL (ref 0.3–1.2)
Total Protein: 5.5 g/dL — ABNORMAL LOW (ref 6.5–8.1)

## 2021-10-11 LAB — CK: Total CK: 2392 U/L — ABNORMAL HIGH (ref 49–397)

## 2021-10-11 NOTE — Discharge Instructions (Addendum)
Gary Robbins, ? ?You were in the hospital with rhabdomyolysis. You were treated with IV fluids and have improved. Please continue to stay hydrated outside the hospital. Aim for clear or light yellow urine. Please follow-up with your primary care physician. Please wait about 2 weeks to resume exercise. ?

## 2021-10-11 NOTE — Discharge Summary (Signed)
?Physician Discharge Summary ?  ?Patient: Gary Robbins MRN: [id number] DOB: June 26, 1986  ?Admit date:     10/08/2021  ?Discharge date: 10/11/2021  ?Discharge Physician: Jacquelin Hawking, MD  ? ?PCP: Allwardt, Crist Infante, PA-C  ? ?Recommendations at discharge:  ? ?PCP follow-up ? ?Discharge Diagnoses: ?Principal Problem: ?  Non-traumatic rhabdomyolysis ?Active Problems: ?  Elevated LFTs ?  Anxiety ? ?Resolved Problems: ?  * No resolved hospital problems. * ? ?Hospital Course: ?Gary Robbins is a 36 y.o. male with a history of anxiety. Patient presented secondary to abnormal labs showing elevated AST/ALT with associated elevated CK, consistent with rhabdomyolysis. IV fluids initiated with improvement of CK. ? ?Assessment and Plan: ?* Non-traumatic rhabdomyolysis ?Secondary to strenuous exercise. CK of 17,490 on admission. Started on high-rate IV fluids. CK down to 2,392 on day of discharge. Patient recommended to continue good hydration. ? ?Anxiety ?Continue hydroxyzine ? ?Elevated LFTs ?Secondary to rhabdomyolysis. Improving with treatment of Rhabdomyolysis. ? ? ? ? ?  ? ? ?Consultants: None ?Procedures performed: None  ?Disposition: Home ?Diet recommendation:  ?Regular diet ?DISCHARGE MEDICATION: ?Allergies as of 10/11/2021   ?No Known Allergies ?  ? ?  ?Medication List  ?  ? ?TAKE these medications   ? ?FIBER ADULT GUMMIES PO ?Take 1 each by mouth daily. ?  ?hydrOXYzine 10 MG tablet ?Commonly known as: ATARAX ?Take 1 tablet (10 mg total) by mouth 4 (four) times daily as needed. Take 10 mg by mouth 4 (four) times daily as needed. ?What changed:  ?when to take this ?additional instructions ?  ?multivitamin tablet ?Take 1 tablet by mouth daily. ?  ?Vitamin D3 50 MCG (2000 UT) capsule ?Take 2,000 Units by mouth daily. ?  ? ?  ? ? Follow-up Information   ? ? Allwardt, Alyssa M, PA-C. Schedule an appointment as soon as possible for a visit in 1 week(s).   ?Specialty: Physician Assistant ?Why: For hospital  follow-up ?Contact information: ?[address] ?[place] [address] ?[phone] ? ? ?  ?  ? ?  ?  ? ?  ? ?Discharge Exam: ?Filed Weights  ? 10/08/21 2115  ?Weight: 75 kg  ? ?General exam: Appears calm and comfortable ? ? ?Condition at discharge: stable ? ?The results of significant diagnostics from this hospitalization (including imaging, microbiology, ancillary and laboratory) are listed below for reference.  ? ?Imaging Studies: ?No results found. ? ?Microbiology: ?Results for orders placed or performed during the hospital encounter of 10/08/21  ?Resp Panel by RT-PCR (Flu A&B, Covid) Nasopharyngeal Swab     Status: None  ? Collection Time: 10/09/21  4:59 AM  ? Specimen: Nasopharyngeal Swab; Nasopharyngeal(NP) swabs in vial transport medium  ?Result Value Ref Range Status  ? SARS Coronavirus 2 by RT PCR NEGATIVE NEGATIVE Final  ?  Comment: (NOTE) ?SARS-CoV-2 target nucleic acids are NOT DETECTED. ? ?The SARS-CoV-2 RNA is generally detectable in upper respiratory ?specimens during the acute phase of infection. The lowest ?concentration of SARS-CoV-2 viral copies this assay can detect is ?138 copies/mL. A negative result does not preclude SARS-Cov-2 ?infection and should not be used as the sole basis for treatment or ?other patient management decisions. A negative result may occur with  ?improper specimen collection/handling, submission of specimen other ?than nasopharyngeal swab, presence of viral mutation(s) within the ?areas targeted by this assay, and inadequate number of viral ?copies(<138 copies/mL). A negative result must be combined with ?clinical observations, patient history, and epidemiological ?information. The expected result is Negative. ? ?Fact Sheet for  Patients:  ?BloggerCourse.com ? ?Fact Sheet for Healthcare Providers:  ?SeriousBroker.it ? ?This test is no t yet approved or cleared by the [place] FDA and  ?has been authorized for  detection and/or diagnosis of SARS-CoV-2 by ?FDA under an Emergency Use Authorization (EUA). This EUA will remain  ?in effect (meaning this test can be used) for the duration of the ?COVID-19 declaration under Section 564(b)(1) of the Act, 21 ?U.S.C.section 360bbb-3(b)(1), unless the authorization is terminated  ?or revoked sooner.  ? ? ?  ? Influenza A by PCR NEGATIVE NEGATIVE Final  ? Influenza B by PCR NEGATIVE NEGATIVE Final  ?  Comment: (NOTE) ?The Xpert Xpress SARS-CoV-2/FLU/RSV plus assay is intended as an aid ?in the diagnosis of influenza from Nasopharyngeal swab specimens and ?should not be used as a sole basis for treatment. Nasal washings and ?aspirates are unacceptable for Xpert Xpress SARS-CoV-2/FLU/RSV ?testing. ? ?Fact Sheet for Patients: ?BloggerCourse.com ? ?Fact Sheet for Healthcare Providers: ?SeriousBroker.it ? ?This test is not yet approved or cleared by the [place] FDA and ?has been authorized for detection and/or diagnosis of SARS-CoV-2 by ?FDA under an Emergency Use Authorization (EUA). This EUA will remain ?in effect (meaning this test can be used) for the duration of the ?COVID-19 declaration under Section 564(b)(1) of the Act, 21 U.S.C. ?section 360bbb-3(b)(1), unless the authorization is terminated or ?revoked. ? ?Performed at [facility], [address] ?[place], [place] [address] ?  ? ? ?Labs: ?CBC: ?Recent Labs  ?Lab 10/07/21 ?1914 10/08/21 ?2215 10/09/21 ?0431  ?WBC 5.5 8.8 6.4  ?NEUTROABS 3.5 5.9 4.4  ?HGB 15.2 14.6 14.8  ?HCT 44.4 42.3 43.9  ?MCV 89.4 86.7 90.0  ?PLT 250.0 259 266  ? ?Basic Metabolic Panel: ?Recent Labs  ?Lab 10/07/21 ?7829 10/08/21 ?2215 10/09/21 ?0431 10/10/21 ?0408 10/11/21 ?0402  ?NA 138 137 138 140 140  ?K 4.2 3.4* 4.2 3.7 3.9  ?CL 102 103 105 110 108  ?CO2 31 27 28 24 26   ?GLUCOSE 92 110* 113* 94 87  ?BUN 12 14 14 12 13   ?CREATININE 0.72 0.67 0.61 0.40* 0.74  ?CALCIUM 9.5 9.6 8.7*  8.2* 8.4*  ?MG  --   --  2.0  --   --   ?PHOS  --   --  3.6  --   --   ? ?Liver Function Tests: ?Recent Labs  ?Lab 10/08/21 ?1425 10/08/21 ?2215 10/09/21 ?0431 10/10/21 ?0408 10/11/21 ?0402  ?AST 593* 524* 470* 217* 127*  ?ALT 182* 172* 162* 108* 88*  ?ALKPHOS 82 67 69 52 54  ?BILITOT 0.6 0.4 0.6 0.4 0.5  ?PROT 6.8 7.0 6.9 5.6* 5.5*  ?ALBUMIN 4.6 4.3 4.0 3.2* 3.2*  ? ? ?Discharge time spent:  35 minutes. ? ?Signed: ?10/12/21, MD ?[facility] ?10/12/2021 ?

## 2021-10-13 ENCOUNTER — Telehealth: Payer: Self-pay

## 2021-10-13 NOTE — Telephone Encounter (Signed)
Transition Care Management Follow-up Telephone Call ?Date of discharge and from where:[facility] 3//18/23 ?How have you been since you were released from the hospital? Feeling ok  ?Any questions or concerns? No ? ?Items Reviewed: ?Did the pt receive and understand the discharge instructions provided? Yes  ?Medications obtained and verified? Yes  ?Other? No  ?Any new allergies since your discharge? No  ?Dietary orders reviewed? Yes ?Do you have support at home? Yes  ? ?Home Care and Equipment/Supplies: ?Were home health services ordered? not applicable ?If so, what is the name of the agency?   ?Has the agency set up a time to come to the patient's home? not applicable ?Were any new equipment or medical supplies ordered?  No ?What is the name of the medical supply agency?  ?Were you able to get the supplies/equipment?  ?Do you have any questions related to the use of the equipment or supplies? No ? ?Functional Questionnaire: (I = Independent and D = Dependent) ?ADLs: I ? ?Bathing/Dressing- I ? ?Meal Prep- I ? ?Eating- I ? ?Maintaining continence- I ? ?Transferring/Ambulation- I ? ?Managing Meds- I ? ?Follow up appointments reviewed: ? ?PCP Hospital f/u appt confirmed? Yes  Scheduled to see Alyssa Allwardt  on 10/15/21 @ 11:00. ?Specialist Hospital f/u appt confirmed? No   ?Are transportation arrangements needed? No  ?If their condition worsens, is the pt aware to call PCP or go to the Emergency Dept.? Yes ?Was the patient provided with contact information for the PCP's office or ED? Yes ?Was to pt encouraged to call back with questions or concerns? Yes ? ?

## 2021-10-15 ENCOUNTER — Ambulatory Visit (INDEPENDENT_AMBULATORY_CARE_PROVIDER_SITE_OTHER): Payer: No Typology Code available for payment source | Admitting: Physician Assistant

## 2021-10-15 VITALS — BP 103/72 | HR 82 | Temp 98.0°F | Ht 67.0 in | Wt 163.2 lb

## 2021-10-15 DIAGNOSIS — R7989 Other specified abnormal findings of blood chemistry: Secondary | ICD-10-CM | POA: Diagnosis not present

## 2021-10-15 DIAGNOSIS — M6282 Rhabdomyolysis: Secondary | ICD-10-CM

## 2021-10-15 LAB — COMPREHENSIVE METABOLIC PANEL
ALT: 87 U/L — ABNORMAL HIGH (ref 0–53)
AST: 44 U/L — ABNORMAL HIGH (ref 0–37)
Albumin: 4.8 g/dL (ref 3.5–5.2)
Alkaline Phosphatase: 75 U/L (ref 39–117)
BUN: 13 mg/dL (ref 6–23)
CO2: 31 mEq/L (ref 19–32)
Calcium: 10 mg/dL (ref 8.4–10.5)
Chloride: 103 mEq/L (ref 96–112)
Creatinine, Ser: 0.78 mg/dL (ref 0.40–1.50)
GFR: 115.13 mL/min (ref >60.00)
Glucose, Bld: 101 mg/dL — ABNORMAL HIGH (ref 70–99)
Potassium: 4.6 mEq/L (ref 3.5–5.1)
Sodium: 140 mEq/L (ref 135–145)
Total Bilirubin: 0.6 mg/dL (ref 0.2–1.2)
Total Protein: 7.2 g/dL (ref 6.0–8.3)

## 2021-10-15 LAB — CK: Total CK: 272 U/L — ABNORMAL HIGH (ref 7–232)

## 2021-10-15 NOTE — Progress Notes (Signed)
? ?Subjective:  ? ? Patient ID: Gary Robbins, male    DOB: 1985-09-16, 36 y.o.   MRN: [id number] ? ?Chief Complaint  ?Patient presents with  ? Hospitalization Follow-up  ? ? ?HPI ?Patient is in today for for hospital follow-up from [facility].  Patient was hospitalized from 3/15 to 10/11/2021 for exertional rhabdomyolysis, elevated CK, elevated liver function tests. ? ?17,490 on admission after he had routine labs done at our office and was found to have elevated liver function tests. This was secondary to strenuous exercise.  IV fluids were initiated.  CK came down to 2392 at day of discharge. ? ?Today patient tells me that he is not having any pain and has not had any pain this whole time.  He is feeling fatigued.  He noticed he was having trouble making it through the grocery store yesterday.  No shortness of breath or dizziness.  He is drinking at least 64 ounces of water daily.  His urine has been clear.  He is not doing any extra exercise right now. ? ?Past Medical History:  ?Diagnosis Date  ? Allergy   ? Anxiety   ? Depression   ? ? ?Past Surgical History:  ?Procedure Laterality Date  ? GUM SURGERY    ? UPPER GI ENDOSCOPY    ? ? ?Family History  ?Problem Relation Age of Onset  ? Asthma Mother   ? Depression Mother   ? Hyperlipidemia Mother   ? Bipolar disorder Mother   ? Hearing loss Father   ? Depression Maternal Grandmother   ? Dementia Maternal Grandmother   ? Arthritis Maternal Grandmother   ? Glaucoma Maternal Grandmother   ? Prostate cancer Maternal Grandfather   ? Bone cancer Maternal Grandfather   ? Heart disease Paternal Grandfather   ? Hyperlipidemia Paternal Grandfather   ? Breast cancer Other   ?     maternal great aunts  ? Diabetes Maternal Great-grandmother   ? Heart disease Maternal Great-grandmother   ? Colon cancer Neg Hx   ? Esophageal cancer Neg Hx   ? Inflammatory bowel disease Neg Hx   ? Liver disease Neg Hx   ? Pancreatic cancer Neg Hx   ? Rectal cancer Neg Hx   ? Stomach cancer Neg  Hx   ? ? ?Social History  ? ?Tobacco Use  ? Smoking status: Never  ? Smokeless tobacco: Never  ?Vaping Use  ? Vaping Use: Never used  ?Substance Use Topics  ? Alcohol use: Not Currently  ?  Comment: OCCASIONAL once a month  ? Drug use: No  ?  ? ?No Known Allergies ? ?Review of Systems ?NEGATIVE UNLESS OTHERWISE INDICATED IN HPI ? ? ?   ?Objective:  ?  ? ?BP 103/72   Pulse 82   Temp 98 ?F (36.7 ?C)   Ht 5\' 7"  (1.702 m)   Wt 163 lb 3.2 oz (74 kg)   SpO2 100%   BMI 25.56 kg/m?  ? ?Wt Readings from Last 3 Encounters:  ?10/15/21 163 lb 3.2 oz (74 kg)  ?10/08/21 165 lb 5.5 oz (75 kg)  ?10/08/21 165 lb 4 oz (75 kg)  ? ? ?BP Readings from Last 3 Encounters:  ?10/15/21 103/72  ?10/11/21 (!) 98/57  ?10/08/21 104/66  ?  ? ?Physical Exam ?Vitals and nursing note reviewed.  ?Constitutional:   ?   General: He is not in acute distress. ?   Appearance: Normal appearance. He is not toxic-appearing.  ?HENT:  ?   Head:  Normocephalic and atraumatic.  ?   Right Ear: External ear normal.  ?   Left Ear: External ear normal.  ?   Mouth/Throat:  ?   Mouth: Mucous membranes are moist.  ?   Pharynx: Oropharynx is clear.  ?Eyes:  ?   Extraocular Movements: Extraocular movements intact.  ?   Conjunctiva/sclera: Conjunctivae normal.  ?   Pupils: Pupils are equal, round, and reactive to light.  ?Cardiovascular:  ?   Rate and Rhythm: Normal rate and regular rhythm.  ?   Pulses: Normal pulses.  ?   Heart sounds: Normal heart sounds.  ?Pulmonary:  ?   Effort: Pulmonary effort is normal.  ?   Breath sounds: Normal breath sounds.  ?Abdominal:  ?   General: Abdomen is flat. Bowel sounds are normal.  ?   Palpations: Abdomen is soft.  ?   Tenderness: There is no abdominal tenderness.  ?Musculoskeletal:     ?   General: Normal range of motion.  ?   Cervical back: Normal range of motion and neck supple.  ?Skin: ?   General: Skin is warm and dry.  ?Neurological:  ?   General: No focal deficit present.  ?   Mental Status: He is alert and oriented to  person, place, and time.  ?Psychiatric:     ?   Mood and Affect: Mood normal.     ?   Behavior: Behavior normal.  ? ? ?   ?Assessment & Plan:  ? ?Problem List Items Addressed This Visit   ? ?  ? Musculoskeletal and Integument  ? Non-traumatic rhabdomyolysis - Primary  ? Relevant Orders  ? CK (Creatine Kinase) (Completed)  ? Comprehensive metabolic panel (Completed)  ? Ambulatory referral to Physical Therapy  ?  ? Other  ? Elevated LFTs  ? Relevant Orders  ? CK (Creatine Kinase) (Completed)  ? Comprehensive metabolic panel (Completed)  ? Ambulatory referral to Physical Therapy  ? ? ?1. Non-traumatic rhabdomyolysis ?2. Elevated LFTs ?I personally reviewed patient's hospital discharge report and lab work.  No changes to his medications.  Plan to repeat CK and LFT panel today.  Encouraged patient to continue pushing fluids.  He needs to limit any strenuous exercise, even excessive walking or stretching right now.  I personally spoke with physical therapist in our office and she will see him in a few weeks to help slowly get him back to physical activity.  Patient to keep me updated with how he is doing and call if any concerns. ? ? ? ?This note was prepared with assistance of Conservation officer, historic buildings. Occasional wrong-word or sound-a-like substitutions may have occurred due to the inherent limitations of voice recognition software. ? ? ?Analia Zuk M [place] Lesure, PA-C ?

## 2021-10-15 NOTE — Patient Instructions (Signed)
Good to see you today! ?Please have labs checked and we will continue to monitor until return to normal. ?Continue to push fluids. ?Consider PT follow up to help with return to activity in a few weeks. REST for now.  ?

## 2021-10-22 ENCOUNTER — Other Ambulatory Visit (INDEPENDENT_AMBULATORY_CARE_PROVIDER_SITE_OTHER): Payer: No Typology Code available for payment source

## 2021-10-22 DIAGNOSIS — M6282 Rhabdomyolysis: Secondary | ICD-10-CM

## 2021-10-22 LAB — COMPREHENSIVE METABOLIC PANEL
ALT: 25 U/L (ref 0–53)
AST: 19 U/L (ref 0–37)
Albumin: 4.5 g/dL (ref 3.5–5.2)
Alkaline Phosphatase: 78 U/L (ref 39–117)
BUN: 13 mg/dL (ref 6–23)
CO2: 28 mEq/L (ref 19–32)
Calcium: 9.5 mg/dL (ref 8.4–10.5)
Chloride: 105 mEq/L (ref 96–112)
Creatinine, Ser: 0.8 mg/dL (ref 0.40–1.50)
GFR: 114.24 mL/min (ref >60.00)
Glucose, Bld: 95 mg/dL (ref 70–99)
Potassium: 3.9 mEq/L (ref 3.5–5.1)
Sodium: 140 mEq/L (ref 135–145)
Total Bilirubin: 0.5 mg/dL (ref 0.2–1.2)
Total Protein: 6.9 g/dL (ref 6.0–8.3)

## 2021-10-22 LAB — CK: Total CK: 95 U/L (ref 7–232)

## 2021-10-29 ENCOUNTER — Encounter: Payer: Self-pay | Admitting: Physical Therapy

## 2021-10-29 ENCOUNTER — Ambulatory Visit (INDEPENDENT_AMBULATORY_CARE_PROVIDER_SITE_OTHER): Payer: No Typology Code available for payment source | Admitting: Physical Therapy

## 2021-10-29 DIAGNOSIS — M6281 Muscle weakness (generalized): Secondary | ICD-10-CM

## 2021-10-29 DIAGNOSIS — R262 Difficulty in walking, not elsewhere classified: Secondary | ICD-10-CM

## 2021-10-29 NOTE — Therapy (Signed)
?OUTPATIENT PHYSICAL THERAPY LOWER EXTREMITY EVALUATION ? ? ?Patient Name: Gary Robbins ?MRN: [id number] ?DOB:1986-04-27, 36 y.o., male ?Today's Date: 10/29/2021 ? ? PT End of Session - 10/29/21 1018   ? ? Visit Number 1   ? Number of Visits 8   ? Date for PT Re-Evaluation 12/24/21   ? Authorization Type UHC   ? PT Start Time 1017   ? PT Stop Time 1057   ? PT Time Calculation (min) 40 min   ? ?  ?  ? ?  ? ? ?Past Medical History:  ?Diagnosis Date  ? Allergy   ? Anxiety   ? Depression   ? ?Past Surgical History:  ?Procedure Laterality Date  ? GUM SURGERY    ? UPPER GI ENDOSCOPY    ? ?Patient Active Problem List  ? Diagnosis Date Noted  ? Non-traumatic rhabdomyolysis 10/09/2021  ? Elevated LFTs 10/09/2021  ? Anxiety 10/09/2021  ? Non-intractable vomiting 11/17/2020  ? Generalized abdominal pain 11/17/2020  ? Unintentional weight loss 11/17/2020  ? Change in bowel habits 11/17/2020  ? Hemorrhoids 11/17/2020  ? BRBPR (bright red blood per rectum) 11/17/2020  ? Irritable bowel syndrome with diarrhea 11/17/2020  ? ? ?PCP: Allwardt, Crist Infante, PA-C ? ?REFERRING PROVIDER: Allwardt, Crist Infante, PA-C ? ?REFERRING DIAG: M62.82 (ICD-10-CM) - Non-traumatic rhabdomyolysis ?R79.89 (ICD-10-CM) - Elevated LFTs ? ?THERAPY DIAG:  ?Muscle weakness (generalized) - Plan: PT plan of care cert/re-cert ? ?Difficulty in walking, not elsewhere classified - Plan: PT plan of care cert/re-cert ? ?ONSET DATE: Early March ? ?SUBJECTIVE:  ? ?SUBJECTIVE STATEMENT: ?States that he went to [employer] with a personal trainer and that Monday he worked legs - squats, lunges, lateral stepping and then that Friday he did a lot of upper body with a lot of weights with a trainer. States that he did a lot of core stuff after. States that all exercises were performed till failure. States that his legs were already very sore from Monday on Friday. States that he was stretching a lot and has been stretching for years and then after Friday he could not fully  straighten his arms. Prior to this return to the gym he hasn't done much lifting, but he was walking about 3 miles day.  Patient is s/p acute rhabdomyolysis which required hospitalization from 3/15 to 3/18.  ?States since his hospitilization he has not been exerting himself. States that he no longer has achiness and but he was getting fatigued with just going to the grocery store.  ? ?PERTINENT HISTORY: ?No concerns ? ?PAIN:  ?Are you having pain? No ? ?PRECAUTIONS: gradual return to activity ? ?WEIGHT BEARING RESTRICTIONS No ? ?FALLS:  ?Has patient fallen in last 6 months? No ? ? ? ?PLOF: Independent ? ?PATIENT GOALS fitness goals - to reduce cholesterol, change muscle to fat ratio, to be able to do pull ups, title boxing  ? ? ?OBJECTIVE:  ? ? ? LE Measurements ?Lower Extremity Right ?10/29/2021 Left ?10/29/2021  ? A/PROM MMT A/PROM MMT  ?Hip Flexion WFL 4 WFL 4  ?Hip Extension [facility] 4+ WFL 4  ?Hip Abduction      ?Hip Adduction  4+  4+  ?Hip Internal rotation      ?Hip External rotation      ?Knee Flexion WFL 4 WFL 4  ?Knee Extension WFL 4* WFL 4*  ?Ankle Dorsiflexion      ?Ankle Plantarflexion  20 heel raises  12 heel raises  ?Ankle Inversion      ?  Ankle Eversion      ? (Blank rows = not tested) ? * pain at insertion points of muscles ? ? ? ? ? ?TODAY'S TREATMENT: ?10/29/2021 ?Therapeutic Exercise: ? Aerobic: return to aerobic activity - start with 5 minutes monitoring symptoms- increase daily by a few minutes, return to stretching ?Supine: ?Prone: ? Seated: ? Standing: ?Neuromuscular Re-education: ?Manual Therapy: ?Therapeutic Activity: ?Self Care: ?Trigger Point Dry Needling:  ?Modalities:  ? ? ?PATIENT EDUCATION:  ?Education details: on current presentation, on HEP, on clinical outcomes score and POC, on importance of hydration, not over doing things, increasing aerobic activity and monitoring during and post exercise symptoms ?Person educated: Patient ?Education method: Explanation, Demonstration, and  Handouts ?Education comprehension: verbalized understanding ? ? ? ?HOME EXERCISE PROGRAM: ?No medbridge at this time ? ?ASSESSMENT: ? ?CLINICAL IMPRESSION: ?Patient is a 36 y.o. male who was seen today for physical therapy evaluation and treatment for s/p Rhabdomyolysis and safe return to activity. Patient presents with weakness and pain with muscle exertion on this date. Educated patient on rehab and POC moving forward. Patient would benefit from skilled PT to improve overall function and QOL and risk further injury. ? ? ?OBJECTIVE IMPAIRMENTS decreased activity tolerance, decreased mobility, difficulty walking, decreased strength, and pain.  ? ?ACTIVITY LIMITATIONS community activity and recreational activities .  ? ?PERSONAL FACTORS Fitness are also affecting patient's functional outcome.  ? ? ?REHAB POTENTIAL: Excellent ? ?CLINICAL DECISION MAKING: Stable/uncomplicated ? ?EVALUATION COMPLEXITY: Low ? ?GOALS: ?Goals reviewed with patient?  yes ? ?SHORT TERM GOALS: ? ?Patient will be independent in self management strategies to improve quality of life and functional outcomes. ?Baseline: new program ?Target date: 11/26/2021 ?Goal status: INITIAL ? ?2.  Patient will report at least 50% improvement in overall symptoms and/or function to demonstrate improved functional mobility ?Baseline: 0% ?Target date: 11/26/2021 ?Goal status: INITIAL ? ?3.  Patient will be able to demonstrate painfree MMT  ?Baseline: painful ?Target date: 11/26/2021 ?Goal status: INITIAL ? ? ? ?LONG TERM GOALS: ? ?Patient will report at least 75% improvement in overall symptoms and/or function to demonstrate improved functional mobility ?Baseline: 0% ?Target date: 12/24/2021 ?Goal status: INITIAL ? ?2.  Patient will be able to demonstrate 10 BW squats with good form and no pain ?Baseline: unable ?Target date: 12/24/2021 ?Goal status: INITIAL ? ?3.  Patient will report confidence in safe return to activities  ?Target date: 12/24/2021 ?Goal status:  INITIAL ? ? ? ?PLAN: ?PT FREQUENCY: 1-2x/week for a total of 8 visits over 8 week certification ? ?PT DURATION: 8 weeks ? ?PLANNED INTERVENTIONS: Therapeutic exercises, Therapeutic activity, Neuromuscular re-education, Balance training, Gait training, Patient/Family education, Joint mobilization, Aquatic Therapy, Dry Needling, Electrical stimulation, Spinal manipulation, Cryotherapy, Moist heat, and Manual therapy ? ?PLAN FOR NEXT SESSION: f/u with aerobic activity, BW exercises, concentric work ? ? ?2:42 PM, 10/29/21 ?Tereasa Coop, DPT ?Physical Therapy with [place] ?[facility]  ?[phone] office ? ?

## 2021-11-04 NOTE — Therapy (Signed)
?OUTPATIENT PHYSICAL THERAPY TREATMENT NOTE ? ? ?Patient Name: Gary Robbins ?MRN: [id number] ?DOB:Mar 20, 1986, 36 y.o., male ?Today's Date: 11/05/2021 ? ?PCP: Allwardt, Crist Infante, PA-C ?REFERRING PROVIDER: Allwardt, Crist Infante, PA-C ? ?END OF SESSION:  ? PT End of Session - 11/05/21 1020   ? ? Visit Number 2   ? Number of Visits 8   ? Date for PT Re-Evaluation 12/24/21   ? Authorization Type UHC   ? PT Start Time 1020   ? PT Stop Time 1102   ? PT Time Calculation (min) 42 min   ? ?  ?  ? ?  ? ? ?Past Medical History:  ?Diagnosis Date  ? Allergy   ? Anxiety   ? Depression   ? ?Past Surgical History:  ?Procedure Laterality Date  ? GUM SURGERY    ? UPPER GI ENDOSCOPY    ? ?Patient Active Problem List  ? Diagnosis Date Noted  ? Non-traumatic rhabdomyolysis 10/09/2021  ? Elevated LFTs 10/09/2021  ? Anxiety 10/09/2021  ? Non-intractable vomiting 11/17/2020  ? Generalized abdominal pain 11/17/2020  ? Unintentional weight loss 11/17/2020  ? Change in bowel habits 11/17/2020  ? Hemorrhoids 11/17/2020  ? BRBPR (bright red blood per rectum) 11/17/2020  ? Irritable bowel syndrome with diarrhea 11/17/2020  ?  ?PCP: Allwardt, Crist Infante, PA-C ?  ?REFERRING PROVIDER: Allwardt, Crist Infante, PA-C ?  ?REFERRING DIAG: M62.82 (ICD-10-CM) - Non-traumatic rhabdomyolysis ?R79.89 (ICD-10-CM) - Elevated LFTs ?  ?THERAPY DIAG:  ?Muscle weakness (generalized) - Plan: PT plan of care cert/re-cert ?  ?Difficulty in walking, not elsewhere classified - Plan: PT plan of care cert/re-cert ?  ?ONSET DATE: Early March ?  ?SUBJECTIVE:  ?  ?SUBJECTIVE STATEMENT: ?11/05/2021 ?States that he is doing well. States that he is up to 20 minutes of walking. States he did have a lot of house projects and he was doing some heavier lifting and Monday he was very fatigued.  ? ?Eval: States that he went to [employer] with a personal trainer and that Monday he worked legs - squats, lunges, lateral stepping and then that Friday he did a lot of upper body with a lot of  weights with a trainer. States that he did a lot of core stuff after. States that all exercises were performed till failure. States that his legs were already very sore from Monday on Friday. States that he was stretching a lot and has been stretching for years and then after Friday he could not fully straighten his arms. Prior to this return to the gym he hasn't done much lifting, but he was walking about 3 miles day.  Patient is s/p acute rhabdomyolysis which required hospitalization from 3/15 to 3/18.  ?States since his hospitilization he has not been exerting himself. States that he no longer has achiness and but he was getting fatigued with just going to the grocery store.  ?  ?PERTINENT HISTORY: ?No concerns ?  ?PAIN:  ?Are you having pain? No ?  ?PRECAUTIONS: gradual return to activity ?  ?WEIGHT BEARING RESTRICTIONS No ?  ?FALLS:  ?Has patient fallen in last 6 months? No ?  ?  ?  ?PLOF: Independent ?  ?PATIENT GOALS fitness goals - to reduce cholesterol, change muscle to fat ratio, to be able to do pull ups, title boxing  ?  ?  ?OBJECTIVE:  ?  ?  ?           LE Measurements ?      ?Lower Extremity Right ?  10/29/2021 Left ?10/29/2021  ?  A/PROM MMT A/PROM MMT  ?Hip Flexion WFL 4 WFL 4  ?[facility]  ?Hip Abduction          ?Hip Adduction   4+   4+  ?Hip Internal rotation          ?Hip External rotation          ?Knee Flexion WFL 4 WFL 4  ?Knee Extension WFL 4* WFL 4*  ?Ankle Dorsiflexion          ?Ankle Plantarflexion   20 heel raises   12 heel raises  ?Ankle Inversion          ?Ankle Eversion          ? (Blank rows = not tested) ?           * pain at insertion points of muscles ?  ?  ?  ?  ?  ?TODAY'S TREATMENT: ?11/05/2021 ?Therapeutic Exercise: ?Supine: ?Prone: swimmers 4x5 bilateral slow and controlled ?   Seated: ?   Standing: full body squats with towels under heels 3x8, table  push ups 4x5  ?Neuromuscular Re-education: hip hinge over tale 10 minutes total with demo ?Manual  Therapy: ?Therapeutic Activity: ?Self Care: ?Trigger Point Dry Needling:  ?Modalities:  ?  ?  ?PATIENT EDUCATION:  ?Education details: types of exercises, exercise frequency/intensity, on cardio and rest breaks ?Person educated: Patient ?Education method: Explanation, Demonstration, and Handouts ?Education comprehension: verbalized understanding ?  ?  ?  ?HOME EXERCISE PROGRAM: ?[id number] ?  ?ASSESSMENT: ?  ?CLINICAL IMPRESSION: ?11/05/2021 ?Session focused on body weight exercises, education on form and postures, rest breaks and difference between total body workouts and isolated muscles groups. Answered all questions and added all new exercises to HEP. Will continue with current POC and add in resistance next session as indicated.  ? ?Eval: Patient is a 36 y.o. male who was seen today for physical therapy evaluation and treatment for s/p Rhabdomyolysis and safe return to activity. Patient presents with weakness and pain with muscle exertion on this date. Educated patient on rehab and POC moving forward. Patient would benefit from skilled PT to improve overall function and QOL and risk further injury. ?  ?  ?OBJECTIVE IMPAIRMENTS decreased activity tolerance, decreased mobility, difficulty walking, decreased strength, and pain.  ?  ?ACTIVITY LIMITATIONS community activity and recreational activities .  ?  ?PERSONAL FACTORS Fitness are also affecting patient's functional outcome.  ?  ?  ?REHAB POTENTIAL: Excellent ?  ?CLINICAL DECISION MAKING: Stable/uncomplicated ?  ?EVALUATION COMPLEXITY: Low ?  ?GOALS: ?Goals reviewed with patient?  yes ?  ?SHORT TERM GOALS: ?  ?Patient will be independent in self management strategies to improve quality of life and functional outcomes. ?Baseline: new program ?Target date: 11/26/2021 ?Goal status: INITIAL ?  ?2.  Patient will report at least 50% improvement in overall symptoms and/or function to demonstrate improved functional mobility ?Baseline: 0% ?Target date: 11/26/2021 ?Goal  status: INITIAL ?  ?3.  Patient will be able to demonstrate painfree MMT  ?Baseline: painful ?Target date: 11/26/2021 ?Goal status: INITIAL ?  ?  ?  ?LONG TERM GOALS: ?  ?Patient will report at least 75% improvement in overall symptoms and/or function to demonstrate improved functional mobility ?Baseline: 0% ?Target date: 12/24/2021 ?Goal status: INITIAL ?  ?2.  Patient will be able to demonstrate 10 BW squats with good form and no pain ?Baseline: unable ?Target date: 12/24/2021 ?Goal status: INITIAL ?  ?3.  Patient will report confidence in safe return to activities  ?Target date: 12/24/2021 ?Goal status: INITIAL ?  ?  ?  ?PLAN: ?PT FREQUENCY: 1-2x/week for a total of 8 visits over 8 week certification ?  ?PT DURATION: 8 weeks ?  ?PLANNED INTERVENTIONS: Therapeutic exercises, Therapeutic activity, Neuromuscular re-education, Balance training, Gait training, Patient/Family education, Joint mobilization, Aquatic Therapy, Dry Needling, Electrical stimulation, Spinal manipulation, Cryotherapy, Moist heat, and Manual therapy ?  ?PLAN FOR NEXT SESSION: BW exercises, concentric work, add in resistance as indicated ?  ?11:08 AM, 11/05/21 ?Tereasa Coop, DPT ?Physical Therapy with [place] ?[facility]  ?[phone] office ? ?

## 2021-11-05 ENCOUNTER — Encounter: Payer: Self-pay | Admitting: Physical Therapy

## 2021-11-05 ENCOUNTER — Ambulatory Visit (INDEPENDENT_AMBULATORY_CARE_PROVIDER_SITE_OTHER): Payer: No Typology Code available for payment source | Admitting: Physical Therapy

## 2021-11-05 ENCOUNTER — Encounter: Payer: No Typology Code available for payment source | Admitting: Physical Therapy

## 2021-11-05 DIAGNOSIS — M6282 Rhabdomyolysis: Secondary | ICD-10-CM | POA: Diagnosis not present

## 2021-11-05 DIAGNOSIS — R262 Difficulty in walking, not elsewhere classified: Secondary | ICD-10-CM | POA: Diagnosis not present

## 2021-11-05 DIAGNOSIS — M6281 Muscle weakness (generalized): Secondary | ICD-10-CM | POA: Diagnosis not present

## 2021-11-12 ENCOUNTER — Ambulatory Visit (INDEPENDENT_AMBULATORY_CARE_PROVIDER_SITE_OTHER): Payer: No Typology Code available for payment source | Admitting: Physical Therapy

## 2021-11-12 ENCOUNTER — Encounter: Payer: Self-pay | Admitting: Physical Therapy

## 2021-11-12 DIAGNOSIS — M6282 Rhabdomyolysis: Secondary | ICD-10-CM

## 2021-11-12 DIAGNOSIS — M6281 Muscle weakness (generalized): Secondary | ICD-10-CM | POA: Diagnosis not present

## 2021-11-12 DIAGNOSIS — R262 Difficulty in walking, not elsewhere classified: Secondary | ICD-10-CM

## 2021-11-12 NOTE — Therapy (Signed)
?OUTPATIENT PHYSICAL THERAPY TREATMENT NOTE ? ? ?Patient Name: Gary Robbins ?MRN: [id number] ?DOB:April 12, 1986, 36 y.o., male ?Today's Date: 11/12/2021 ? ?PCP: Allwardt, Randa Evens, PA-C ?REFERRING PROVIDER: Allwardt, Randa Evens, PA-C ? ?END OF SESSION:  ? PT End of Session - 11/12/21 1259   ? ? Visit Number 3   ? Number of Visits 8   ? Date for PT Re-Evaluation 12/24/21   ? Authorization Type UHC   ? PT Start Time 1300   ? PT Stop Time 1340   ? PT Time Calculation (min) 40 min   ? ?  ?  ? ?  ? ? ?Past Medical History:  ?Diagnosis Date  ? Allergy   ? Anxiety   ? Depression   ? ?Past Surgical History:  ?Procedure Laterality Date  ? GUM SURGERY    ? UPPER GI ENDOSCOPY    ? ?Patient Active Problem List  ? Diagnosis Date Noted  ? Non-traumatic rhabdomyolysis 10/09/2021  ? Elevated LFTs 10/09/2021  ? Anxiety 10/09/2021  ? Non-intractable vomiting 11/17/2020  ? Generalized abdominal pain 11/17/2020  ? Unintentional weight loss 11/17/2020  ? Change in bowel habits 11/17/2020  ? Hemorrhoids 11/17/2020  ? BRBPR (bright red blood per rectum) 11/17/2020  ? Irritable bowel syndrome with diarrhea 11/17/2020  ?  ?PCP: Allwardt, Randa Evens, PA-C ?  ?REFERRING PROVIDER: Allwardt, Randa Evens, PA-C ?  ?REFERRING DIAG: M62.82 (ICD-10-CM) - Non-traumatic rhabdomyolysis ?R79.89 (ICD-10-CM) - Elevated LFTs ?  ?THERAPY DIAG:  ?Muscle weakness (generalized) - Plan: PT plan of care cert/re-cert ?  ?Difficulty in walking, not elsewhere classified - Plan: PT plan of care cert/re-cert ?  ?ONSET DATE: Early March ?  ?SUBJECTIVE:  ?  ?SUBJECTIVE STATEMENT: ?11/12/2021 ?Reports overall improvement and feels good. States that he feels 100% better and is still cautious but feels good since coming in. ? ?Eval: States that he went to [employer] with a personal trainer and that Monday he worked legs - squats, lunges, lateral stepping and then that Friday he did a lot of upper body with a lot of weights with a trainer. States that he did a lot of core stuff  after. States that all exercises were performed till failure. States that his legs were already very sore from Monday on Friday. States that he was stretching a lot and has been stretching for years and then after Friday he could not fully straighten his arms. Prior to this return to the gym he hasn't done much lifting, but he was walking about 3 miles day.  Patient is s/p acute rhabdomyolysis which required hospitalization from 3/15 to 3/18.  ?States since his hospitilization he has not been exerting himself. States that he no longer has achiness and but he was getting fatigued with just going to the grocery store.  ?  ?PERTINENT HISTORY: ?No concerns ?  ?PAIN:  ?Are you having pain? No ?  ?PRECAUTIONS: gradual return to activity ?  ?WEIGHT BEARING RESTRICTIONS No ?  ?FALLS:  ?Has patient fallen in last 6 months? No ?  ?  ?  ?PLOF: Independent ?  ?PATIENT GOALS fitness goals - to reduce cholesterol, change muscle to fat ratio, to be able to do pull ups, title boxing  ?  ?  ?OBJECTIVE:  ?  ?  ?           LE Measurements ?      ?Lower Extremity Right ?11/12/2021 Left ?11/12/2021  ?  A/PROM MMT A/PROM MMT  ?Hip Flexion WFL 5 WFL 5  ?  Hip Extension WFL 5 WFL 5  ?Hip Abduction          ?Hip Adduction   4+   4+  ?Hip Internal rotation          ?Hip External rotation          ?Knee Flexion WFL 5 WFL 5  ?Knee Extension WFL 5 WFL 5  ?Ankle Dorsiflexion          ?Ankle Plantarflexion        ?Ankle Inversion          ?Ankle Eversion          ? (Blank rows = not tested) ?           * pain at insertion points of muscles ?  ?  ?  ?  ?  ?TODAY'S TREATMENT: ?11/12/2021 ?Therapeutic Exercise: ?Supine: ?   Seated: ?   Standing:Circuit x4( shadow boxing 1 minutes, invisible jump rope 30 seconds, crunches on ball x10), BW squats x10 full depth ?  ?  ? ?Manual Therapy: ?Therapeutic Activity: ?Self Care: ?Trigger Point Dry Needling:  ?Modalities:  ?  ?  ?PATIENT EDUCATION:  ?Education details: on mirroring boxing class a home in smaller  increments ?Person educated: Patient ?Education method: Explanation, Demonstration, and Handouts ?Education comprehension: verbalized understanding ?  ?  ?  ?HOME EXERCISE PROGRAM: ?[id number] ?  ?ASSESSMENT: ?  ?CLINICAL IMPRESSION: ?11/12/2021 ?Session focused on education and mimicking title boxing class as patient would like to transition to this class after therapy has concluded. 30 second rest breaks taken on this date. Reviewed goals and all but one goal met at this time. Generalized fatigue noted end of session but no pain or discomfort. ? ?Eval: Patient is a 36 y.o. male who was seen today for physical therapy evaluation and treatment for s/p Rhabdomyolysis and safe return to activity. Patient presents with weakness and pain with muscle exertion on this date. Educated patient on rehab and POC moving forward. Patient would benefit from skilled PT to improve overall function and QOL and risk further injury. ?  ?  ?OBJECTIVE IMPAIRMENTS decreased activity tolerance, decreased mobility, difficulty walking, decreased strength, and pain.  ?  ?ACTIVITY LIMITATIONS community activity and recreational activities .  ?  ?PERSONAL FACTORS Fitness are also affecting patient's functional outcome.  ?  ?  ?REHAB POTENTIAL: Excellent ?  ?CLINICAL DECISION MAKING: Stable/uncomplicated ?  ?EVALUATION COMPLEXITY: Low ?  ?GOALS: ?Goals reviewed with patient?  yes ?  ?SHORT TERM GOALS: ?  ?Patient will be independent in self management strategies to improve quality of life and functional outcomes. ?Baseline: new program ?Target date: 11/26/2021 ?Goal status: MET ?  ?2.  Patient will report at least 50% improvement in overall symptoms and/or function to demonstrate improved functional mobility ?Baseline: 0% ?Target date: 11/26/2021 ?Goal status: MET ?  ?3.  Patient will be able to demonstrate painfree MMT  ?Baseline: painful ?Target date: 11/26/2021 ?Goal status: INITIAL ?  ?  ?  ?LONG TERM GOALS: ?  ?Patient will report at least 75%  improvement in overall symptoms and/or function to demonstrate improved functional mobility ?Baseline: 0% ?Target date: 12/24/2021 ?Goal status: MET ?  ?2.  Patient will be able to demonstrate 10 BW squats with good form and no pain ?Baseline: unable ?Target date: 12/24/2021 ?Goal status: MET ?  ?3.  Patient will report confidence in safe return to activities  ?Target date: 12/24/2021 ?Goal status: INITIAL ?  ?  ?  ?PLAN: ?PT FREQUENCY: 1-2x/week  for a total of 8 visits over 8 week certification ?  ?PT DURATION: 8 weeks ?  ?PLANNED INTERVENTIONS: Therapeutic exercises, Therapeutic activity, Neuromuscular re-education, Balance training, Gait training, Patient/Family education, Joint mobilization, Aquatic Therapy, Dry Needling, Electrical stimulation, Spinal manipulation, Cryotherapy, Moist heat, and Manual therapy ?  ?PLAN FOR NEXT SESSION: f/u with boxing circuit and add in resistance training/eccentric work if indicated - patient may transition to title 9 boxing class or continue with PT or be placed on hold ? ?1:00 PM, 11/12/21 ?Jerene Pitch, DPT ?Physical Therapy with [place] ?[facility]  ?[phone] office ? ?

## 2021-11-17 ENCOUNTER — Telehealth: Payer: Self-pay | Admitting: Physician Assistant

## 2021-11-17 ENCOUNTER — Other Ambulatory Visit: Payer: Self-pay

## 2021-11-17 MED ORDER — HYDROXYZINE HCL 10 MG PO TABS: 10.0000 mg | ORAL_TABLET | Freq: Four times a day (QID) | ORAL | 1 refills | Status: DC | PRN

## 2021-11-17 NOTE — Telephone Encounter (Signed)
Refill sent to pharmacy.   

## 2021-11-17 NOTE — Telephone Encounter (Signed)
.. ?  Encourage patient to contact the pharmacy for refills or they can request refills through [facility] ? ?LAST APPOINTMENT DATE:   ?10/15/21 ? ?NEXT APPOINTMENT DATE: ?03/18/22 ? ?MEDICATION: ? ?hydrOXYzine (ATARAX) 10 MG tablet [[id number]]  ? ?Is the patient out of medication?  ?Has 4 left as of 04/24 ? ?PHARMACY: ?[facility] ?[address]  ?[place], [place] [address] ? ?Let patient know to contact pharmacy at the end of the day to make sure medication is ready. ? ?Please notify patient to allow 48-72 hours to process  ?

## 2021-11-18 ENCOUNTER — Ambulatory Visit (INDEPENDENT_AMBULATORY_CARE_PROVIDER_SITE_OTHER): Payer: No Typology Code available for payment source | Admitting: Physical Therapy

## 2021-11-18 ENCOUNTER — Encounter: Payer: Self-pay | Admitting: Physical Therapy

## 2021-11-18 DIAGNOSIS — M6281 Muscle weakness (generalized): Secondary | ICD-10-CM

## 2021-11-18 DIAGNOSIS — R262 Difficulty in walking, not elsewhere classified: Secondary | ICD-10-CM

## 2021-11-18 DIAGNOSIS — M6282 Rhabdomyolysis: Secondary | ICD-10-CM

## 2021-11-18 NOTE — Therapy (Signed)
?OUTPATIENT PHYSICAL THERAPY TREATMENT NOTE and Discharge Note ? ? ?Patient Name: Gary Robbins ?MRN: [id number] ?DOB:Nov 28, 1985, 36 y.o., male ?Today's Date: 11/18/2021 ? ?PCP: Allwardt, Randa Evens, PA-C ?REFERRING PROVIDER: Allwardt, Randa Evens, PA-C ? ?PHYSICAL THERAPY DISCHARGE SUMMARY ? ?Visits from Start of Care: 4 ? ?Current functional level related to goals / functional outcomes: ?All goals met ?  ?Remaining deficits: ?none ?  ?Education / Equipment: ?See below  ? ?Patient agrees to discharge. Patient goals were met. Patient is being discharged due to meeting the stated rehab goals. ? ? ?END OF SESSION:  ? PT End of Session - 11/18/21 1621   ? ? Visit Number 4   ? Number of Visits 8   ? Date for PT Re-Evaluation 12/24/21   ? Authorization Type [employer]   ? PT Start Time 1602   ? PT Stop Time 6761   ? PT Time Calculation (min) 16 min   ? ?  ?  ? ?  ? ? ? ?Past Medical History:  ?Diagnosis Date  ? Allergy   ? Anxiety   ? Depression   ? ?Past Surgical History:  ?Procedure Laterality Date  ? GUM SURGERY    ? UPPER GI ENDOSCOPY    ? ?Patient Active Problem List  ? Diagnosis Date Noted  ? Non-traumatic rhabdomyolysis 10/09/2021  ? Elevated LFTs 10/09/2021  ? Anxiety 10/09/2021  ? Non-intractable vomiting 11/17/2020  ? Generalized abdominal pain 11/17/2020  ? Unintentional weight loss 11/17/2020  ? Change in bowel habits 11/17/2020  ? Hemorrhoids 11/17/2020  ? BRBPR (bright red blood per rectum) 11/17/2020  ? Irritable bowel syndrome with diarrhea 11/17/2020  ?  ?PCP: Allwardt, Randa Evens, PA-C ?  ?REFERRING PROVIDER: Allwardt, Randa Evens, PA-C ?  ?REFERRING DIAG: M62.82 (ICD-10-CM) - Non-traumatic rhabdomyolysis ?R79.89 (ICD-10-CM) - Elevated LFTs ?  ?THERAPY DIAG:  ?Muscle weakness (generalized) - Plan: PT plan of care cert/re-cert ?  ?Difficulty in walking, not elsewhere classified - Plan: PT plan of care cert/re-cert ?  ?ONSET DATE: Early March ?  ?SUBJECTIVE:  ?  ?SUBJECTIVE STATEMENT: ?11/18/2021 ?Reports 98% better,  states he feels confident returning to prior activities and is aware of what he should do to reduce risk of another Rhabo incident  ? ?  ?PERTINENT HISTORY: ?No concerns ?  ?PAIN:  ?Are you having pain? No ?  ?PRECAUTIONS: gradual return to activity ?  ?WEIGHT BEARING RESTRICTIONS No ?  ?FALLS:  ?Has patient fallen in last 6 months? No ?  ?  ?  ?PLOF: Independent ?  ?PATIENT GOALS fitness goals - to reduce cholesterol, change muscle to fat ratio, to be able to do pull ups, title boxing  ?  ?  ?OBJECTIVE:  ?  ?  ?           LE Measurements ?      ?Lower Extremity Right ?11/18/2021 ? Left ?11/18/2021 ?  ?  A/PROM MMT A/PROM MMT  ?Hip Flexion WFL 5 WFL 5  ?Hip Extension WFL 5 WFL 5  ?Hip Abduction          ?Hip Adduction   5   5  ?Hip Internal rotation          ?Hip External rotation          ?Knee Flexion WFL 5 WFL 5  ?Knee Extension WFL 5 WFL 5  ?Ankle Dorsiflexion          ?Ankle Plantarflexion        ?Ankle Inversion          ?  Ankle Eversion          ? (Blank rows = not tested) ?           * pain at insertion points of muscles ?  ?  ?  ?  ?  ?TODAY'S TREATMENT: ?11/18/2021 ?Therapeutic Exercise: ? ? ?Manual Therapy: ?Therapeutic Activity: ?Self Care: ?Trigger Point Dry Needling:  ?Modalities:  ?  ?  ?PATIENT EDUCATION:  ?Education details: on POC, on goals, on importance of easing into activity, on hydration. On HEP ?Person educated: Patient ?Education method: Explanation, Demonstration, and Handouts ?Education comprehension: verbalized understanding ?  ?  ?  ?HOME EXERCISE PROGRAM: ?[id number] ?  ?ASSESSMENT: ?  ?CLINICAL IMPRESSION: ?11/18/2021 ?All goals met a this session. Reviewed plan to return to activity and patient confident in safe return. No issues noted and patient would like to discharge from PT a this time. Answered all questions prior to end of session. ? ?Eval: Patient is a 36 y.o. male who was seen today for physical therapy evaluation and treatment for s/p Rhabdomyolysis and safe return to activity.  Patient presents with weakness and pain with muscle exertion on this date. Educated patient on rehab and POC moving forward. Patient would benefit from skilled PT to improve overall function and QOL and risk further injury. ?  ?  ?OBJECTIVE IMPAIRMENTS decreased activity tolerance, decreased mobility, difficulty walking, decreased strength, and pain.  ?  ?ACTIVITY LIMITATIONS community activity and recreational activities .  ?  ?PERSONAL FACTORS Fitness are also affecting patient's functional outcome.  ?  ?  ?REHAB POTENTIAL: Excellent ?  ?CLINICAL DECISION MAKING: Stable/uncomplicated ?  ?EVALUATION COMPLEXITY: Low ?  ?GOALS: ?Goals reviewed with patient?  yes ?  ?SHORT TERM GOALS: ?  ?Patient will be independent in self management strategies to improve quality of life and functional outcomes. ?Baseline: new program ?Target date: 11/26/2021 ?Goal status: MET ?  ?2.  Patient will report at least 50% improvement in overall symptoms and/or function to demonstrate improved functional mobility ?Baseline: 0% ?Target date: 11/26/2021 ?Goal status: MET ?  ?3.  Patient will be able to demonstrate painfree MMT  ?Baseline: painful ?Target date: 11/26/2021 ?Goal status: MET ?  ?  ?  ?LONG TERM GOALS: ?  ?Patient will report at least 75% improvement in overall symptoms and/or function to demonstrate improved functional mobility ?Baseline: 0% ?Target date: 12/24/2021 ?Goal status: MET ?  ?2.  Patient will be able to demonstrate 10 BW squats with good form and no pain ?Baseline: unable ?Target date: 12/24/2021 ?Goal status: MET ?  ?3.  Patient will report confidence in safe return to activities  ?Target date: 12/24/2021 ?Goal status: MET ?  ?  ?  ?PLAN: ?PT FREQUENCY: 1-2x/week for a total of 8 visits over 8 week certification ?  ?PT DURATION: 8 weeks ?  ?PLANNED INTERVENTIONS: Therapeutic exercises, Therapeutic activity, Neuromuscular re-education, Balance training, Gait training, Patient/Family education, Joint mobilization, Aquatic  Therapy, Dry Needling, Electrical stimulation, Spinal manipulation, Cryotherapy, Moist heat, and Manual therapy ?  ?PLAN FOR NEXT SESSION: DC to HEP ? ?4:25 PM, 11/18/21 ?Jerene Pitch, DPT ?Physical Therapy with [facility] ?[facility]  ?[phone] office ? ?

## 2021-11-19 ENCOUNTER — Encounter: Payer: No Typology Code available for payment source | Admitting: Physical Therapy

## 2021-11-25 ENCOUNTER — Ambulatory Visit
Admission: EM | Admit: 2021-11-25 | Discharge: 2021-11-25 | Disposition: A | Discharge status: Other Acute Inpatient Hospital | Payer: No Typology Code available for payment source

## 2021-11-26 ENCOUNTER — Encounter: Payer: No Typology Code available for payment source | Admitting: Physical Therapy

## 2021-12-03 ENCOUNTER — Encounter: Payer: Self-pay | Admitting: Physician Assistant

## 2022-03-18 ENCOUNTER — Encounter: Payer: Self-pay | Admitting: Physician Assistant

## 2022-03-18 ENCOUNTER — Other Ambulatory Visit (INDEPENDENT_AMBULATORY_CARE_PROVIDER_SITE_OTHER): Payer: No Typology Code available for payment source

## 2022-03-18 ENCOUNTER — Ambulatory Visit (INDEPENDENT_AMBULATORY_CARE_PROVIDER_SITE_OTHER): Payer: No Typology Code available for payment source | Admitting: Physician Assistant

## 2022-03-18 VITALS — BP 90/60 | HR 63 | Temp 98.1°F | Ht 67.0 in | Wt 159.6 lb

## 2022-03-18 DIAGNOSIS — F410 Panic disorder [episodic paroxysmal anxiety] without agoraphobia: Secondary | ICD-10-CM

## 2022-03-18 DIAGNOSIS — F411 Generalized anxiety disorder: Secondary | ICD-10-CM | POA: Diagnosis not present

## 2022-03-18 DIAGNOSIS — E782 Mixed hyperlipidemia: Secondary | ICD-10-CM

## 2022-03-18 LAB — LIPID PANEL
Cholesterol: 186 mg/dL (ref 0–200)
HDL: 38.7 mg/dL — ABNORMAL LOW (ref >39.00)
LDL Cholesterol: 129 mg/dL — ABNORMAL HIGH (ref 0–99)
NonHDL: 147.64
Total CHOL/HDL Ratio: 5
Triglycerides: 95 mg/dL (ref 0.0–149.0)
VLDL: 19 mg/dL (ref 0.0–40.0)

## 2022-03-18 NOTE — Progress Notes (Unsigned)
Subjective:    Patient ID: Gary Robbins, male    DOB: October 12, 1985, 36 y.o.   MRN: [id number]  Chief Complaint  Patient presents with   med check    HPI Patient is in today for f/up. Following clean diet, staying active. Doing a lot of body weight, esp from PT exercises, running in place, basketball, walking frequently. Stand-up desk. Fasting today, wanting to check lipid panel.  Mental health - doing very well; regular f/up with counselor. Taking hydroxyzine 10 mg at bedtime. Baby girl due early October. Son in 2nd grade.   Past Medical History:  Diagnosis Date   Allergy    Anxiety    Depression     Past Surgical History:  Procedure Laterality Date   GUM SURGERY     UPPER GI ENDOSCOPY      Family History  Problem Relation Age of Onset   Asthma Mother    Depression Mother    Hyperlipidemia Mother    Bipolar disorder Mother    Hearing loss Father    Depression Maternal Grandmother    Dementia Maternal Grandmother    Arthritis Maternal Grandmother    Glaucoma Maternal Grandmother    Prostate cancer Maternal Grandfather    Bone cancer Maternal Grandfather    Heart disease Paternal Grandfather    Hyperlipidemia Paternal Grandfather    Breast cancer Other        maternal great aunts   Diabetes Maternal Great-grandmother    Heart disease Maternal Great-grandmother    Colon cancer Neg Hx    Esophageal cancer Neg Hx    Inflammatory bowel disease Neg Hx    Liver disease Neg Hx    Pancreatic cancer Neg Hx    Rectal cancer Neg Hx    Stomach cancer Neg Hx     Social History   Tobacco Use   Smoking status: Never   Smokeless tobacco: Never  Vaping Use   Vaping Use: Never used  Substance Use Topics   Alcohol use: Not Currently    Comment: OCCASIONAL once a month   Drug use: No     No Known Allergies  Review of Systems NEGATIVE UNLESS OTHERWISE INDICATED IN HPI      Objective:     BP 90/60   Pulse 63   Temp 98.1 F (36.7 C)   Ht 5\' 7"  (1.702 m)    Wt 159 lb 9.6 oz (72.4 kg)   SpO2 99%   BMI 25.00 kg/m   Wt Readings from Last 3 Encounters:  03/18/22 159 lb 9.6 oz (72.4 kg)  10/15/21 163 lb 3.2 oz (74 kg)  10/08/21 165 lb 5.5 oz (75 kg)    BP Readings from Last 3 Encounters:  03/18/22 90/60  10/15/21 103/72  10/11/21 (!) 98/57     Physical Exam Vitals and nursing note reviewed.  Constitutional:      General: He is not in acute distress.    Appearance: Normal appearance. He is not toxic-appearing.  HENT:     Head: Normocephalic and atraumatic.  Eyes:     Extraocular Movements: Extraocular movements intact.     Conjunctiva/sclera: Conjunctivae normal.     Pupils: Pupils are equal, round, and reactive to light.  Cardiovascular:     Rate and Rhythm: Normal rate and regular rhythm.     Pulses: Normal pulses.     Heart sounds: Normal heart sounds.  Pulmonary:     Effort: Pulmonary effort is normal.     Breath sounds: Normal  breath sounds.  Musculoskeletal:        General: Normal range of motion.     Cervical back: Normal range of motion and neck supple.  Skin:    General: Skin is warm and dry.  Neurological:     General: No focal deficit present.     Mental Status: He is alert and oriented to person, place, and time.  Psychiatric:        Mood and Affect: Mood normal.        Behavior: Behavior normal.        Assessment & Plan:   Problem List Items Addressed This Visit   None Visit Diagnoses     Generalized anxiety disorder with panic attacks    -  Primary   Mixed hyperlipidemia       Relevant Orders   Lipid panel      1. Generalized anxiety disorder with panic attacks ***  2. Mixed hyperlipidemia ***     Return in about 6 months (around 09/18/2022) for recheck.  This note was prepared with assistance of Conservation officer, historic buildings. Occasional wrong-word or sound-a-like substitutions may have occurred due to the inherent limitations of voice recognition software.  Time Spent: ***  minutes of total time was spent on the date of the encounter performing the following actions: chart review prior to seeing the patient, obtaining history, performing a medically necessary exam, counseling on the treatment plan, placing orders, and documenting in our EHR.       Terril Amaro M Staton Markey, PA-C

## 2022-03-18 NOTE — Patient Instructions (Signed)
Keep up the great work! Congratulations on new baby girl!

## 2022-03-19 DIAGNOSIS — F410 Panic disorder [episodic paroxysmal anxiety] without agoraphobia: Secondary | ICD-10-CM | POA: Insufficient documentation

## 2022-03-19 DIAGNOSIS — E782 Mixed hyperlipidemia: Secondary | ICD-10-CM | POA: Insufficient documentation

## 2022-03-19 NOTE — Assessment & Plan Note (Signed)
He has been working hard on lifestyle changes. Encouraged him to keep up good work. Recheck fasting lipid panel today.

## 2022-03-19 NOTE — Assessment & Plan Note (Signed)
Stable Doing very well  Meeting regularly with counselor Hydroxyzine 10 mg at bedtime helps; not due for refills

## 2022-04-19 ENCOUNTER — Other Ambulatory Visit: Payer: Self-pay | Admitting: Physician Assistant

## 2022-04-20 ENCOUNTER — Encounter: Payer: Self-pay | Admitting: *Deleted

## 2022-05-04 ENCOUNTER — Encounter: Payer: Self-pay | Admitting: Physician Assistant

## 2022-05-04 NOTE — Telephone Encounter (Signed)
Please see pt msg and advise if you recommend an office visit to discuss or want me to send in referral

## 2022-05-14 ENCOUNTER — Other Ambulatory Visit: Payer: Self-pay

## 2022-05-14 DIAGNOSIS — Z3009 Encounter for other general counseling and advice on contraception: Secondary | ICD-10-CM

## 2022-09-14 ENCOUNTER — Ambulatory Visit (INDEPENDENT_AMBULATORY_CARE_PROVIDER_SITE_OTHER): Payer: No Typology Code available for payment source | Admitting: Physician Assistant

## 2022-09-14 ENCOUNTER — Encounter: Payer: Self-pay | Admitting: Physician Assistant

## 2022-09-14 VITALS — BP 108/76 | HR 72 | Temp 97.5°F | Ht 67.0 in | Wt 165.4 lb

## 2022-09-14 DIAGNOSIS — F411 Generalized anxiety disorder: Secondary | ICD-10-CM | POA: Diagnosis not present

## 2022-09-14 DIAGNOSIS — F410 Panic disorder [episodic paroxysmal anxiety] without agoraphobia: Secondary | ICD-10-CM

## 2022-09-14 MED ORDER — HYDROXYZINE HCL 10 MG PO TABS: 10.0000 mg | ORAL_TABLET | Freq: Every evening | ORAL | 3 refills | Status: AC

## 2022-09-14 NOTE — Patient Instructions (Signed)
Keep up the great work!

## 2022-09-14 NOTE — Assessment & Plan Note (Signed)
Stable with Hydroxyzine 10 mg qhs; refilled today Doing great with every day life, good lifestyle changes / habits, very proud of him

## 2022-09-14 NOTE — Progress Notes (Signed)
Subjective:    Patient ID: Gary Robbins, male    DOB: [birth year], 37 y.o.   MRN: [id number]  Chief Complaint  Patient presents with   Follow-up    Pt in for f/u; pt has no concerns to discuss;     HPI Patient is in today for 6 month f/up.  Taking hydroxyzine 10 mg at the evening; sleeping well and also helps his allergies. 64 mo old baby, Louretta Parma, doing very well, new addition to family.  Hasn't been needing his counselor, in a really good place.  Daily meditations for the last year. Reading daily. Journals daily. Exercises three times per week. Full-body aerobic / weight lifting / resistance.  Doesn't eat cheese anymore. Low sodium and saturated fats.   Past Medical History:  Diagnosis Date   Allergy    Anxiety    Depression     Past Surgical History:  Procedure Laterality Date   GUM SURGERY     UPPER GI ENDOSCOPY      Family History  Problem Relation Age of Onset   Asthma Mother    Depression Mother    Hyperlipidemia Mother    Bipolar disorder Mother    Hearing loss Father    Depression Maternal Grandmother    Dementia Maternal Grandmother    Arthritis Maternal Grandmother    Glaucoma Maternal Grandmother    Prostate cancer Maternal Grandfather    Bone cancer Maternal Grandfather    Heart disease Paternal Grandfather    Hyperlipidemia Paternal Grandfather    Breast cancer Other        maternal great aunts   Diabetes Maternal Great-grandmother    Heart disease Maternal Great-grandmother    Colon cancer Neg Hx    Esophageal cancer Neg Hx    Inflammatory bowel disease Neg Hx    Liver disease Neg Hx    Pancreatic cancer Neg Hx    Rectal cancer Neg Hx    Stomach cancer Neg Hx     Social History   Tobacco Use   Smoking status: Never   Smokeless tobacco: Never  Vaping Use   Vaping Use: Never used  Substance Use Topics   Alcohol use: Not Currently    Comment: OCCASIONAL once a month   Drug use: No     No Known Allergies  Review of  Systems NEGATIVE UNLESS OTHERWISE INDICATED IN HPI      Objective:     BP 108/76 (BP Location: Left Arm)   Pulse 72   Temp (!) 97.5 F (36.4 C) (Temporal)   Ht 5' 7"$  (1.702 m)   Wt 165 lb 6.4 oz (75 kg)   SpO2 99%   BMI 25.91 kg/m   Wt Readings from Last 3 Encounters:  09/14/22 165 lb 6.4 oz (75 kg)  03/18/22 159 lb 9.6 oz (72.4 kg)  10/15/21 163 lb 3.2 oz (74 kg)    BP Readings from Last 3 Encounters:  09/14/22 108/76  03/18/22 90/60  10/15/21 103/72     Physical Exam Vitals and nursing note reviewed.  Constitutional:      Appearance: Normal appearance.  Cardiovascular:     Rate and Rhythm: Normal rate and regular rhythm.     Pulses: Normal pulses.  Pulmonary:     Effort: Pulmonary effort is normal.  Neurological:     General: No focal deficit present.     Mental Status: He is alert and oriented to person, place, and time.  Psychiatric:  Mood and Affect: Mood normal.        Behavior: Behavior normal.        Assessment & Plan:  Generalized anxiety disorder with panic attacks Assessment & Plan: Stable with Hydroxyzine 10 mg qhs; refilled today Doing great with every day life, good lifestyle changes / habits, very proud of him   Other orders -     hydrOXYzine HCl; Take 1 tablet (10 mg total) by mouth every evening.  Dispense: 90 tablet; Refill: 3        Return in about 1 year (around 09/15/2023) for CPE, fasting labs .      Dewey Neukam M Roan Sawchuk, PA-C

## 2022-09-16 ENCOUNTER — Ambulatory Visit: Payer: No Typology Code available for payment source | Admitting: Physician Assistant

## 2022-11-06 ENCOUNTER — Encounter: Payer: Self-pay | Admitting: Physician Assistant

## 2022-11-09 ENCOUNTER — Telehealth (INDEPENDENT_AMBULATORY_CARE_PROVIDER_SITE_OTHER): Payer: No Typology Code available for payment source | Admitting: Physician Assistant

## 2022-11-09 ENCOUNTER — Encounter: Payer: Self-pay | Admitting: Physician Assistant

## 2022-11-09 VITALS — Ht 67.0 in | Wt 165.0 lb

## 2022-11-09 DIAGNOSIS — F410 Panic disorder [episodic paroxysmal anxiety] without agoraphobia: Secondary | ICD-10-CM

## 2022-11-09 DIAGNOSIS — F411 Generalized anxiety disorder: Secondary | ICD-10-CM

## 2022-11-09 NOTE — Progress Notes (Signed)
   Virtual Visit via Video Note  I connected with  [employer]  on 11/09/22 at  9:45 AM EDT by a video enabled telemedicine application and verified that I am speaking with the correct person using two identifiers.  Location: Patient: home Provider: Nature conservation officer at [employer] Persons present: Patient and myself   I discussed the limitations of evaluation and management by telemedicine and the availability of in person appointments. The patient expressed understanding and agreed to proceed.   History of Present Illness:  37 yo male presenting for VV f/up GAD. Still doing very well. Ready to discontinue hydroxyzine 10 mg.   Observations/Objective:   Gen: Awake, alert, no acute distress Resp: Breathing is even and non-labored Psych: calm/pleasant demeanor Neuro: Alert and Oriented x 3, + facial symmetry, speech is clear.   Assessment and Plan:  1. Generalized anxiety disorder with panic attacks Greatly improved, very much resolved. Lifestyle changes have been helpful. Ok to discontinue hydroxyzine 10 mg at this time, I don't suspect any issues with him stopping this medication. He'll reach out if any concerns.  F/up in 1 year or prn.    Follow Up Instructions:    I discussed the assessment and treatment plan with the patient. The patient was provided an opportunity to ask questions and all were answered. The patient agreed with the plan and demonstrated an understanding of the instructions.   The patient was advised to call back or seek an in-person evaluation if the symptoms worsen or if the condition fails to improve as anticipated.  Video connection was lost at >50% of the duration of the visit, at which time the remainder of the visit was completed via audio only.  Total time on phone was 6 mins and 30 secs.    Kristee Angus M Saarah Dewing, PA-C

## 2022-11-17 IMAGING — US US ABDOMEN COMPLETE
1 series · 14 of 25 positions shown · non-contrast
Comparison: None.

CLINICAL DATA: Unintentional loss of weight with change of bowel
habits and bright red blood per rectum.

EXAM:
ABDOMEN ULTRASOUND COMPLETE

[Series 1: us abdomen complete · 14 of 96 slices shown]
[im 1/96]
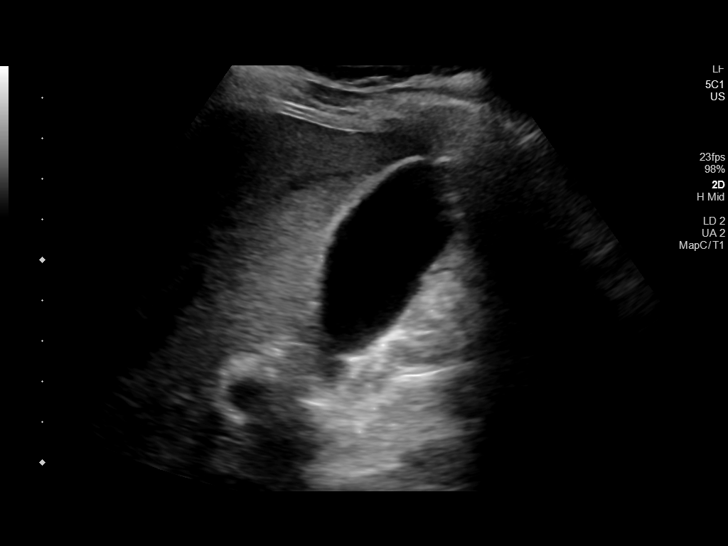
[im 8/96]
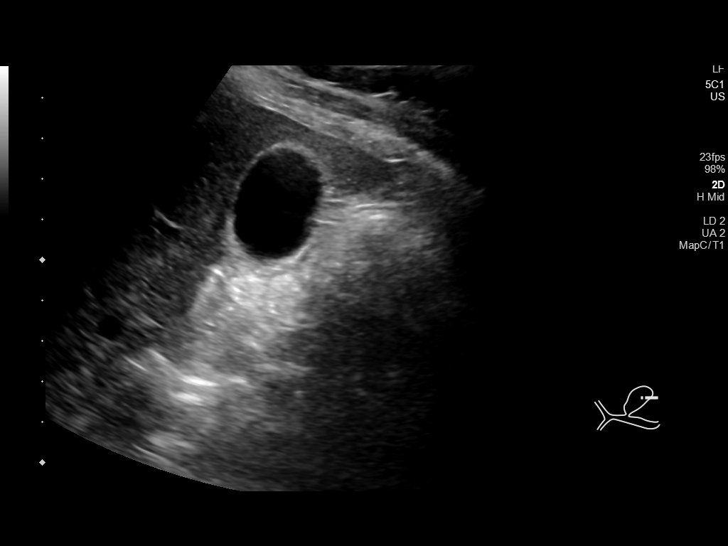
[im 16/96]
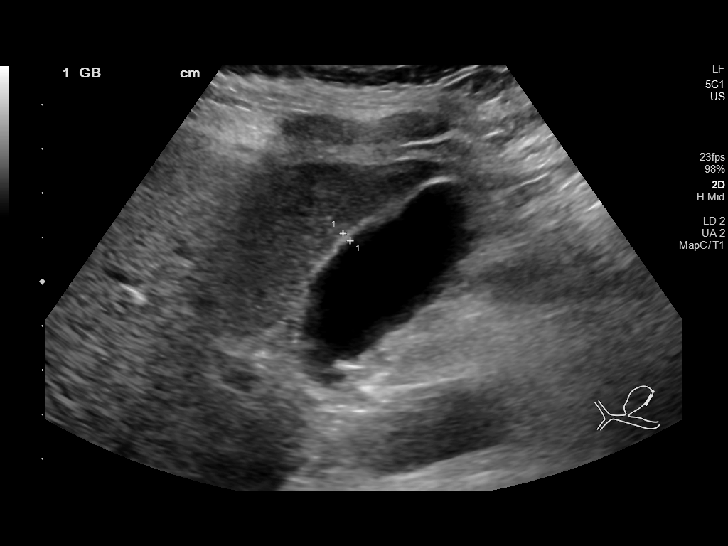
[im 24/96]
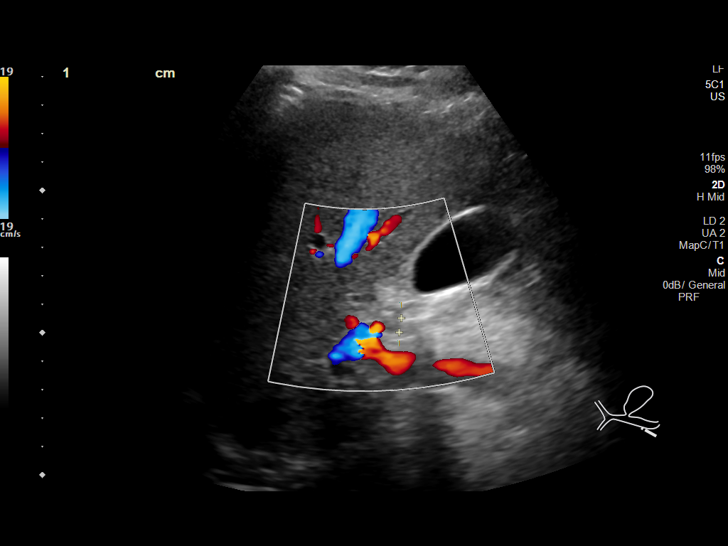
[im 32/96]
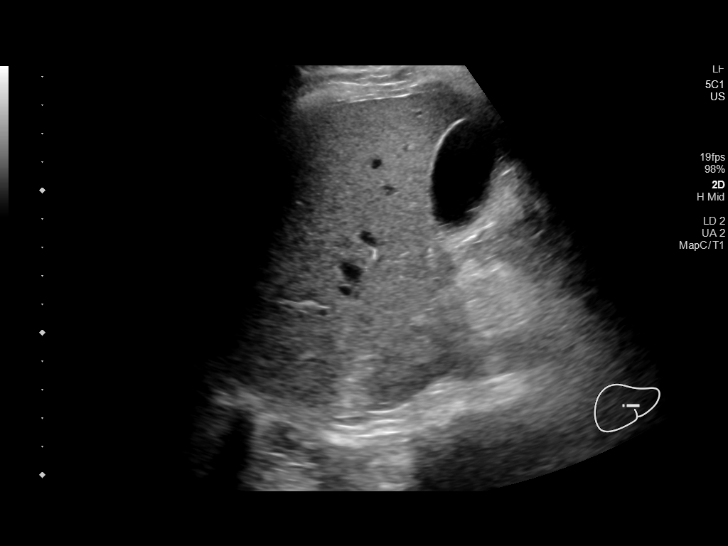
[im 36/96]
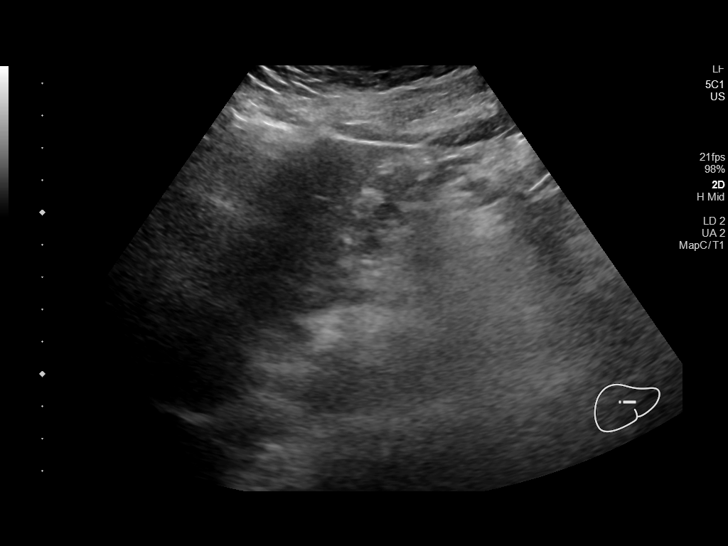
[im 44/96]
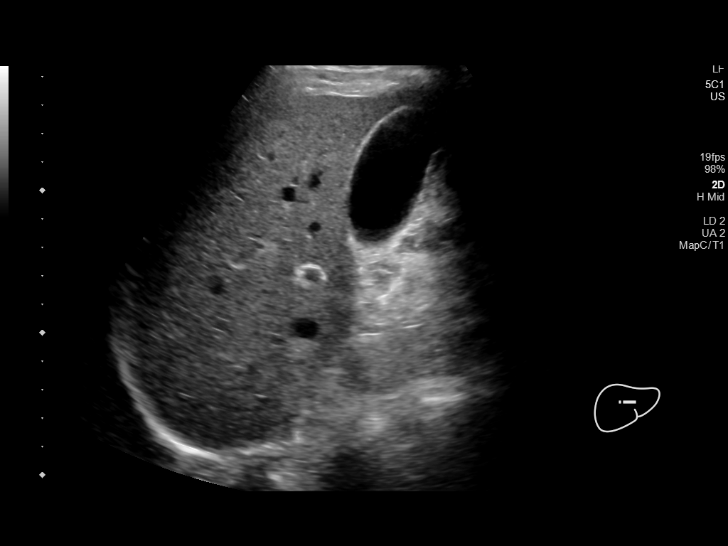
[im 52/96]
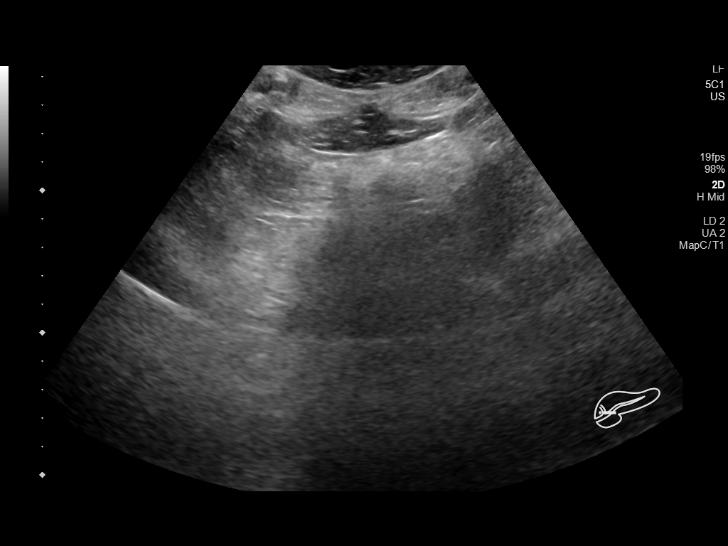
[im 60/96]
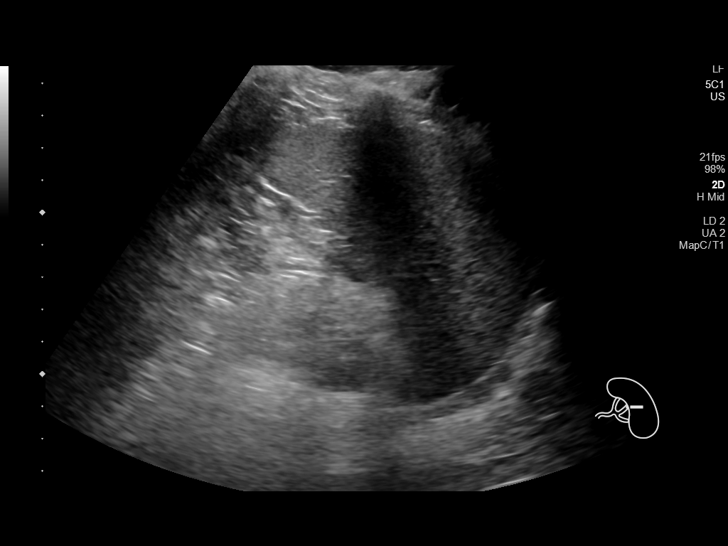
[im 64/96]
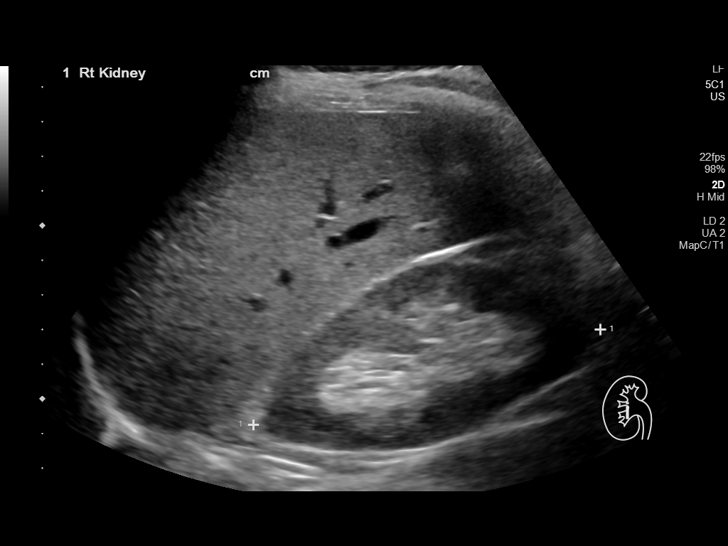
[im 72/96]
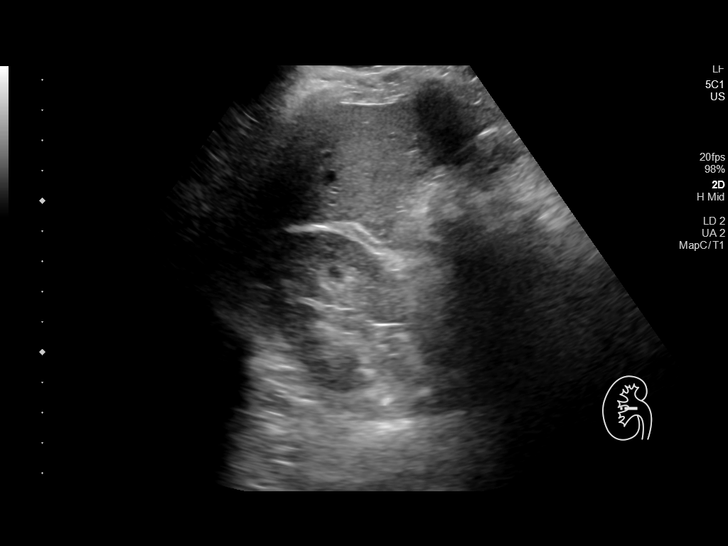
[im 80/96]
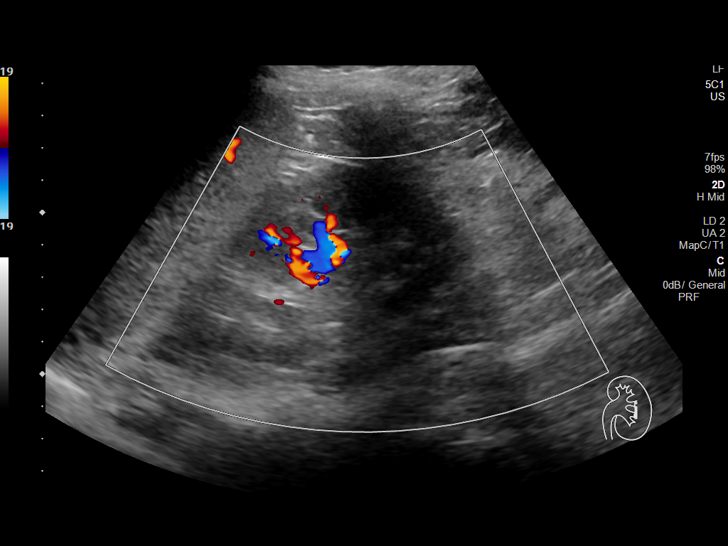
[im 88/96]
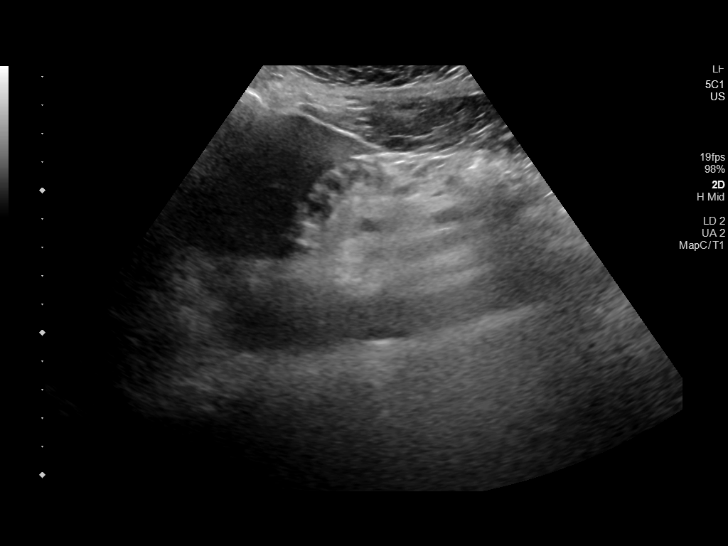
[im 96/96]
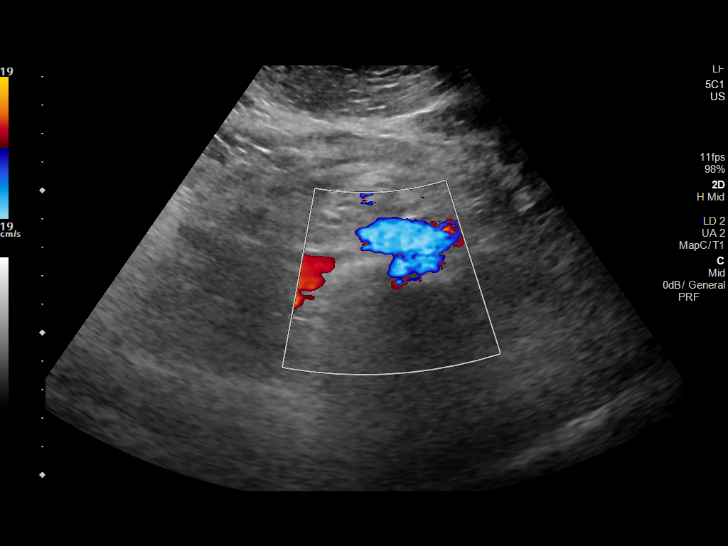

[14 of 25 positions shown; findings below may reference images not displayed]

FINDINGS: Gallbladder: No gallstones or wall thickening visualized. No
sonographic Murphy sign noted by sonographer.

Common bile duct: Diameter: 5.1 mm

Liver: No focal lesion identified. Within normal limits in
parenchymal echogenicity. Portal vein is patent on color Doppler
imaging with normal direction of blood flow towards the liver.

IVC: No abnormality visualized.

Pancreas: Not visualized due to overlying bowel gas

Spleen: Size and appearance within normal limits.

Right Kidney: Length: 10.4 cm. Echogenicity within normal limits. No
mass or hydronephrosis visualized.

Left Kidney: Length: 11.2 cm. Echogenicity within normal limits. No
mass or hydronephrosis visualized.

Abdominal aorta: No aneurysm visualized.

Other findings: None.
IMPRESSION: 1. No acute findings and no explanation for patient's loss of
weight.

## 2023-03-28 ENCOUNTER — Encounter: Payer: Self-pay | Admitting: Physician Assistant

## 2023-03-30 NOTE — Telephone Encounter (Signed)
Please see pt msg and advise 

## 2023-04-01 ENCOUNTER — Telehealth (INDEPENDENT_AMBULATORY_CARE_PROVIDER_SITE_OTHER): Payer: No Typology Code available for payment source | Admitting: Physician Assistant

## 2023-04-01 VITALS — Ht 67.0 in | Wt 165.0 lb

## 2023-04-01 DIAGNOSIS — L608 Other nail disorders: Secondary | ICD-10-CM

## 2023-04-01 NOTE — Progress Notes (Signed)
   Virtual Visit via Video Note  I connected with  [employer]  on 04/01/23 at 11:15 AM EDT by a video enabled telemedicine application and verified that I am speaking with the correct person using two identifiers.  Location: Patient: home Provider: Nature conservation officer at [employer] Persons present: Patient and myself   I discussed the limitations of evaluation and management by telemedicine and the availability of in person appointments. The patient expressed understanding and agreed to proceed.   History of Present Illness:  37 yo male presenting for nail concern. Left thumbnail - earlier this year started to develop a black line under the nail. Seems to be more prominent now. No pain or itching. No wt loss or fevers or other concerns. No family history of melanoma. No personal history of melanoma. Always wears sunscreen, avoids sun exposure often.    Observations/Objective:   Gen: Awake, alert, no acute distress Resp: Breathing is even and non-labored Psych: calm/pleasant demeanor Neuro: Alert and Oriented x 3, + facial symmetry, speech is clear.  Photo of L thumbnail uploaded by patient:   Assessment and Plan:  1. Change in nail appearance Referral placed to dermatology for worst case concern of possible new melanoma. Pt to continue to monitor.     Follow Up Instructions:    I discussed the assessment and treatment plan with the patient. The patient was provided an opportunity to ask questions and all were answered. The patient agreed with the plan and demonstrated an understanding of the instructions.   The patient was advised to call back or seek an in-person evaluation if the symptoms worsen or if the condition fails to improve as anticipated.  Brigida Scotti M Dusty Raczkowski, PA-C

## 2023-04-08 ENCOUNTER — Encounter: Payer: Self-pay | Admitting: Dermatology

## 2023-04-08 ENCOUNTER — Ambulatory Visit: Payer: No Typology Code available for payment source | Admitting: Dermatology

## 2023-04-08 VITALS — BP 113/67 | HR 75

## 2023-04-08 DIAGNOSIS — L609 Nail disorder, unspecified: Secondary | ICD-10-CM

## 2023-04-08 NOTE — Progress Notes (Signed)
   New Patient Visit   Subjective  Gary Robbins is a 37 y.o. male who presents for the following: Black line in nail  Patient states he  has black line located at the left thumb nail that he would like to have examined. Patient reports the areas have been there for 3 months. He reports the areas are not bothersome.Patient rates irritation 0 out of 10. He states that the areas have not spread. Patient reports he  has not previously been treated for these areas. Patient denies Hx of bx. Patient reports family history of skin cancer(s).  The patient has spots, moles and lesions to be evaluated, some may be new or changing and the patient may have concern these could be cancer.   The following portions of the chart were reviewed this encounter and updated as appropriate: medications, allergies, medical history  Review of Systems:  No other skin or systemic complaints except as noted in HPI or Assessment and Plan.  Objective  Well appearing patient in no apparent distress; mood and affect are within normal limits.  A focused examination was performed of the following areas: Left Thumb Nail  Relevant exam findings are noted in the Assessment and Plan.       Assessment & Plan   Distal Linear Dark Subungual Lesion- Ddx Subungual hemorrhage vs subungual debris vs less likely pigmented lesion Exam: Small linear black Line  Treatment Plan: - Benign-appearing.   - Observation.   - Call clinic for new or changing lesions.   - We plan to follow up in 3 months to reassess and if needed nail bx. Patient agrees   Return in about 3 months (around 07/08/2023) for Nail F/U (Possible Nail Bx).  Documentation: I have reviewed the above documentation for accuracy and completeness, and I agree with the above.  I, Nell Range, am acting as scribe for Gwenith Daily, MD.  Gwenith Daily, MD

## 2023-04-08 NOTE — Patient Instructions (Signed)

## 2023-07-08 ENCOUNTER — Ambulatory Visit: Payer: No Typology Code available for payment source | Admitting: Dermatology

## 2023-09-14 ENCOUNTER — Encounter: Payer: No Typology Code available for payment source | Admitting: Physician Assistant

## 2023-09-23 ENCOUNTER — Ambulatory Visit (INDEPENDENT_AMBULATORY_CARE_PROVIDER_SITE_OTHER): Payer: No Typology Code available for payment source | Admitting: Physician Assistant

## 2023-09-23 ENCOUNTER — Encounter: Payer: Self-pay | Admitting: Physician Assistant

## 2023-09-23 VITALS — BP 112/78 | HR 107 | Temp 97.9°F | Ht 67.0 in | Wt 154.2 lb

## 2023-09-23 DIAGNOSIS — Z0000 Encounter for general adult medical examination without abnormal findings: Secondary | ICD-10-CM

## 2023-09-23 LAB — COMPREHENSIVE METABOLIC PANEL
ALT: 15 U/L (ref 0–53)
AST: 20 U/L (ref 0–37)
Albumin: 4.5 g/dL (ref 3.5–5.2)
Alkaline Phosphatase: 92 U/L (ref 39–117)
BUN: 11 mg/dL (ref 6–23)
CO2: 29 meq/L (ref 19–32)
Calcium: 9.4 mg/dL (ref 8.4–10.5)
Chloride: 105 meq/L (ref 96–112)
Creatinine, Ser: 0.73 mg/dL (ref 0.40–1.50)
GFR: 115.87 mL/min (ref >60.00)
Glucose, Bld: 92 mg/dL (ref 70–99)
Potassium: 4.1 meq/L (ref 3.5–5.1)
Sodium: 140 meq/L (ref 135–145)
Total Bilirubin: 0.6 mg/dL (ref 0.2–1.2)
Total Protein: 7.5 g/dL (ref 6.0–8.3)

## 2023-09-23 LAB — CBC WITH DIFFERENTIAL/PLATELET
Basophils Absolute: 0 10*3/uL (ref 0.0–0.1)
Basophils Relative: 0.3 % (ref 0.0–3.0)
Eosinophils Absolute: 0 10*3/uL (ref 0.0–0.7)
Eosinophils Relative: 0.8 % (ref 0.0–5.0)
HCT: 45.3 % (ref 39.0–52.0)
Hemoglobin: 15.4 g/dL (ref 13.0–17.0)
Lymphocytes Relative: 32.1 % (ref 12.0–46.0)
Lymphs Abs: 1.4 10*3/uL (ref 0.7–4.0)
MCHC: 34 g/dL (ref 30.0–36.0)
MCV: 91.2 fl (ref 78.0–100.0)
Monocytes Absolute: 0.4 10*3/uL (ref 0.1–1.0)
Monocytes Relative: 8.5 % (ref 3.0–12.0)
Neutro Abs: 2.5 10*3/uL (ref 1.4–7.7)
Neutrophils Relative %: 58.3 % (ref 43.0–77.0)
Platelets: 227 10*3/uL (ref 150.0–400.0)
RBC: 4.97 Mil/uL (ref 4.22–5.81)
RDW: 12.7 % (ref 11.5–15.5)
WBC: 4.3 10*3/uL (ref 4.0–10.5)

## 2023-09-23 LAB — LIPID PANEL
Cholesterol: 180 mg/dL (ref 0–200)
HDL: 41.9 mg/dL (ref >39.00)
LDL Cholesterol: 122 mg/dL — ABNORMAL HIGH (ref 0–99)
NonHDL: 137.64
Total CHOL/HDL Ratio: 4
Triglycerides: 76 mg/dL (ref 0.0–149.0)
VLDL: 15.2 mg/dL (ref 0.0–40.0)

## 2023-09-23 LAB — HEMOGLOBIN A1C: Hgb A1c MFr Bld: 4.8 % (ref 4.6–6.5)

## 2023-09-23 LAB — TSH: TSH: 2.32 u[IU]/mL (ref 0.35–5.50)

## 2023-09-23 NOTE — Progress Notes (Signed)
 Patient ID: Gary Robbins, male    DOB: 1986-07-25, 38 y.o.   MRN: [id number]   Assessment & Plan:  Encounter for annual physical exam -     CBC with Differential/Platelet -     Comprehensive metabolic panel -     Lipid panel -     TSH -     Hemoglobin A1c   Age-appropriate screening and counseling performed today. Will check labs and call with results. Preventive measures discussed and printed in AVS for patient.   Patient Counseling: [x]   Nutrition: Stressed importance of moderation in sodium/caffeine intake, saturated fat and cholesterol, caloric balance, sufficient intake of fresh fruits, vegetables, and fiber.  [x]   Stressed the importance of regular exercise.   [x]   Substance Abuse: Discussed cessation/primary prevention of tobacco, alcohol, or other drug use; driving or other dangerous activities under the influence; availability of treatment for abuse.   []   Injury prevention: Discussed safety belts, safety helmets, smoke detector, smoking near bedding or upholstery.   []   Sexuality: Discussed sexually transmitted diseases, partner selection, use of condoms, avoidance of unintended pregnancy  and contraceptive alternatives.   [x]   Dental health: Discussed importance of regular tooth brushing, flossing, and dental visits.  [x]   Health maintenance and immunizations reviewed. Please refer to Health maintenance section.         Return in about 1 year (around 09/22/2024) for physical.    Subjective:    Chief Complaint  Patient presents with   Annual Exam    HPI   History of Present Illness   [name] is a 38 year old male who presents for an annual physical exam.  He is anxious about reviewing lab results and prefers not to view them until he is reviewed by the doctor.  He maintains a consistent exercise routine, focusing on body weight exercises due to a previous rhabdomyolysis scare. His routine includes fast-paced walking for 40 minutes  to an hour, often with his daughter in a stroller, and incorporates squats, hamstring exercises, pushups, and light weights for shoulders and back. He also includes light cardio and aims for full-body workouts multiple times a week.  He adheres strictly to his diet, limiting cheese intake to once a week and monitoring saturated fat intake to a maximum of two grams per serving. He has significantly reduced sugar intake and eliminated caffeine, opting for decaffeinated coffee or tea. He prepares meals at home to control salt intake, which is minimal.  He has a history of severe stomach viruses that have led to hospitalizations due to dehydration. During the recent winter months, he successfully managed a similar illness without vomiting or requiring hospitalization, attributing this to staying hydrated and managing anxiety, which he notes is often linked to his nervous system.  No skin changes, rashes, moles, or itchiness. He mentions getting new tattoos since the last visit, which were completed in multiple sessions, with the longest session lasting eleven hours.    '  Past Medical History:  Diagnosis Date   Allergy    Anxiety    Depression     Past Surgical History:  Procedure Laterality Date   GUM SURGERY     UPPER GI ENDOSCOPY      Family History  Problem Relation Age of Onset   Asthma Mother    Depression Mother    Hyperlipidemia Mother    Bipolar disorder Mother    Hearing loss Father    Depression Maternal Grandmother    Dementia  Maternal Grandmother    Arthritis Maternal Grandmother    Glaucoma Maternal Grandmother    Prostate cancer Maternal Grandfather    Bone cancer Maternal Grandfather    Heart disease Paternal Grandfather    Hyperlipidemia Paternal Grandfather    Breast cancer Other        maternal great aunts   Diabetes Maternal Great-grandmother    Heart disease Maternal Great-grandmother    Colon cancer Neg Hx    Esophageal cancer Neg Hx    Inflammatory bowel  disease Neg Hx    Liver disease Neg Hx    Pancreatic cancer Neg Hx    Rectal cancer Neg Hx    Stomach cancer Neg Hx     Social History   Tobacco Use   Smoking status: Never    Passive exposure: Never   Smokeless tobacco: Never  Vaping Use   Vaping status: Never Used  Substance Use Topics   Alcohol use: Not Currently    Comment: OCCASIONAL once a month   Drug use: No     No Known Allergies  Review of Systems NEGATIVE UNLESS OTHERWISE INDICATED IN HPI      Objective:     BP 112/78   Pulse (!) 107   Temp 97.9 F (36.6 C) (Temporal)   Ht 5\' 7"  (1.702 m)   Wt 154 lb 3.2 oz (69.9 kg)   SpO2 98%   BMI 24.15 kg/m   Wt Readings from Last 3 Encounters:  09/23/23 154 lb 3.2 oz (69.9 kg)  04/01/23 165 lb (74.8 kg)  11/09/22 165 lb (74.8 kg)    BP Readings from Last 3 Encounters:  09/23/23 112/78  04/08/23 113/67  09/14/22 108/76     Physical Exam Vitals and nursing note reviewed.  Constitutional:      General: He is not in acute distress.    Appearance: Normal appearance. He is not toxic-appearing.  HENT:     Head: Normocephalic and atraumatic.     Right Ear: Tympanic membrane, ear canal and external ear normal.     Left Ear: Tympanic membrane, ear canal and external ear normal.     Nose: Nose normal.     Mouth/Throat:     Mouth: Mucous membranes are moist.     Pharynx: Oropharynx is clear.  Eyes:     Extraocular Movements: Extraocular movements intact.     Conjunctiva/sclera: Conjunctivae normal.     Pupils: Pupils are equal, round, and reactive to light.  Cardiovascular:     Rate and Rhythm: Normal rate and regular rhythm.     Pulses: Normal pulses.     Heart sounds: Normal heart sounds.  Pulmonary:     Effort: Pulmonary effort is normal.     Breath sounds: Normal breath sounds.  Abdominal:     General: Abdomen is flat. Bowel sounds are normal.     Palpations: Abdomen is soft.     Tenderness: There is no abdominal tenderness.  Musculoskeletal:         General: Normal range of motion.     Cervical back: Normal range of motion and neck supple.  Skin:    General: Skin is warm and dry.  Neurological:     General: No focal deficit present.     Mental Status: He is alert and oriented to person, place, and time.  Psychiatric:        Mood and Affect: Mood normal.        Behavior: Behavior normal.  Gary Robbins M Yandriel Boening, PA-C

## 2024-01-10 ENCOUNTER — Encounter: Payer: Self-pay | Admitting: Physician Assistant

## 2024-05-09 ENCOUNTER — Encounter: Payer: Self-pay | Admitting: Physician Assistant

## 2024-05-09 NOTE — Telephone Encounter (Signed)
 Please see pt msg and advise any recommendations

## 2024-08-23 ENCOUNTER — Encounter: Payer: Self-pay | Admitting: Physician Assistant

## 2024-08-23 NOTE — Telephone Encounter (Signed)
 Please review and advise. Tks

## 2024-09-25 ENCOUNTER — Encounter: Payer: No Typology Code available for payment source | Admitting: Physician Assistant
